# Patient Record
Sex: Female | Born: 1975 | Race: White | Hispanic: No | Marital: Married | Smoking: Never smoker
Health system: Southern US, Community
[De-identification: ages and names within clinical notes are randomized; demographics above are authoritative.]

## PROBLEM LIST (undated history)

## (undated) DIAGNOSIS — Z87442 Personal history of urinary calculi: Secondary | ICD-10-CM

## (undated) DIAGNOSIS — K219 Gastro-esophageal reflux disease without esophagitis: Secondary | ICD-10-CM

## (undated) DIAGNOSIS — J00 Acute nasopharyngitis [common cold]: Secondary | ICD-10-CM

## (undated) DIAGNOSIS — N200 Calculus of kidney: Secondary | ICD-10-CM

---

## 2007-04-18 ENCOUNTER — Emergency Department (HOSPITAL_COMMUNITY): Admission: EM | Admit: 2007-04-18 | Discharge: 2007-04-18 | Payer: Self-pay | Admitting: Family Medicine

## 2010-01-30 ENCOUNTER — Emergency Department (HOSPITAL_BASED_OUTPATIENT_CLINIC_OR_DEPARTMENT_OTHER)
Admission: EM | Admit: 2010-01-30 | Discharge: 2010-01-30 | Payer: Self-pay | Source: Home / Self Care | Admitting: Emergency Medicine

## 2010-01-31 ENCOUNTER — Encounter
Admission: RE | Admit: 2010-01-31 | Discharge: 2010-01-31 | Payer: Self-pay | Source: Home / Self Care | Attending: Family Medicine | Admitting: Family Medicine

## 2010-02-01 ENCOUNTER — Ambulatory Visit (HOSPITAL_COMMUNITY)
Admission: RE | Admit: 2010-02-01 | Discharge: 2010-02-02 | Payer: Self-pay | Source: Home / Self Care | Attending: Orthopedic Surgery | Admitting: Orthopedic Surgery

## 2010-02-01 HISTORY — PX: OTHER SURGICAL HISTORY: SHX169

## 2010-02-05 LAB — BASIC METABOLIC PANEL
Calcium: 9.2 mg/dL (ref 8.4–10.5)
Creatinine, Ser: 0.95 mg/dL (ref 0.4–1.2)
GFR calc Af Amer: >60 mL/min (ref >60)
GFR calc non Af Amer: >60 mL/min (ref >60)
Glucose, Bld: 109 mg/dL — ABNORMAL HIGH (ref 70–99)
Sodium: 139 mEq/L (ref 135–145)

## 2010-02-05 LAB — CBC
MCH: 26.8 pg (ref 26.0–34.0)
Platelets: 256 10*3/uL (ref 150–400)
RBC: 5.04 MIL/uL (ref 3.87–5.11)
WBC: 10.3 10*3/uL (ref 4.0–10.5)

## 2010-02-05 LAB — PROTIME-INR: Prothrombin Time: 12.7 seconds (ref 11.6–15.2)
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
## 2011-11-02 IMAGING — CR DG CHEST 2V
2 series · 2 of 2 positions shown · non-contrast
Comparison: None.

CLINICAL DATA: Preop

CHEST - 2 VIEW

[view not recorded (1 of 2)]
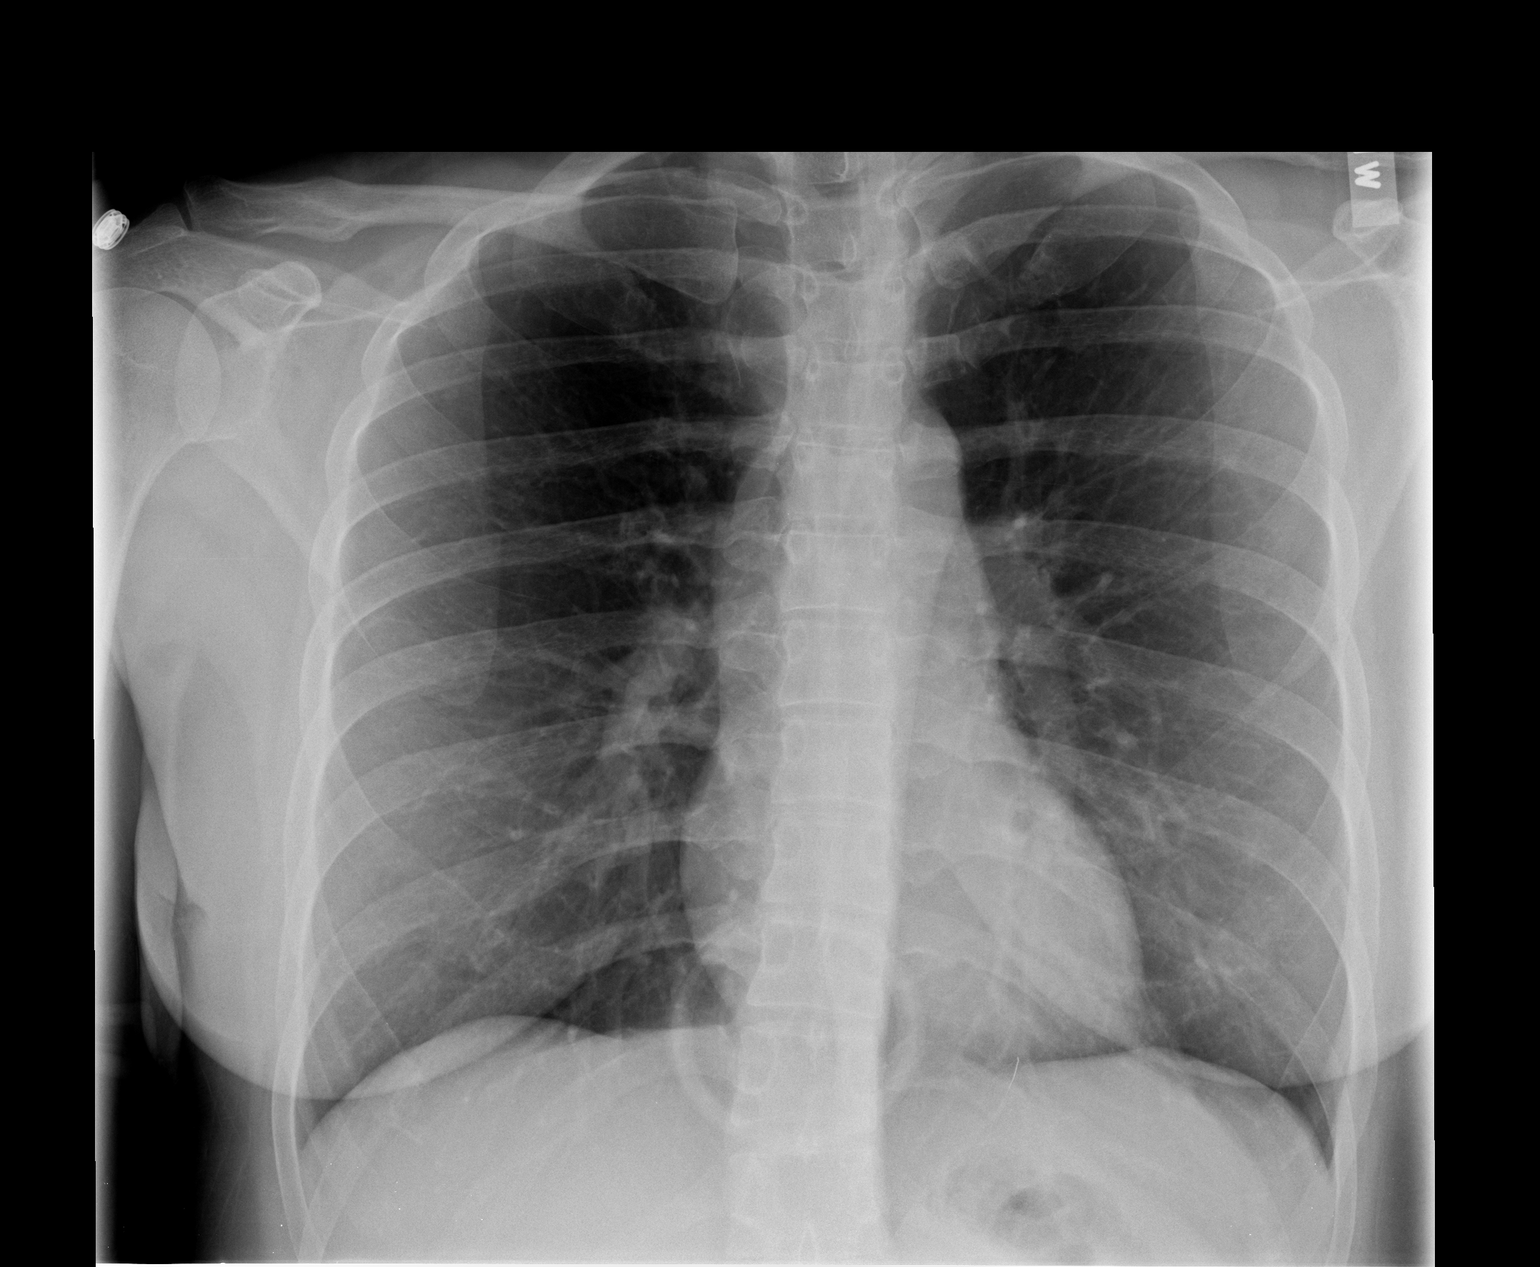

[view not recorded (2 of 2)]
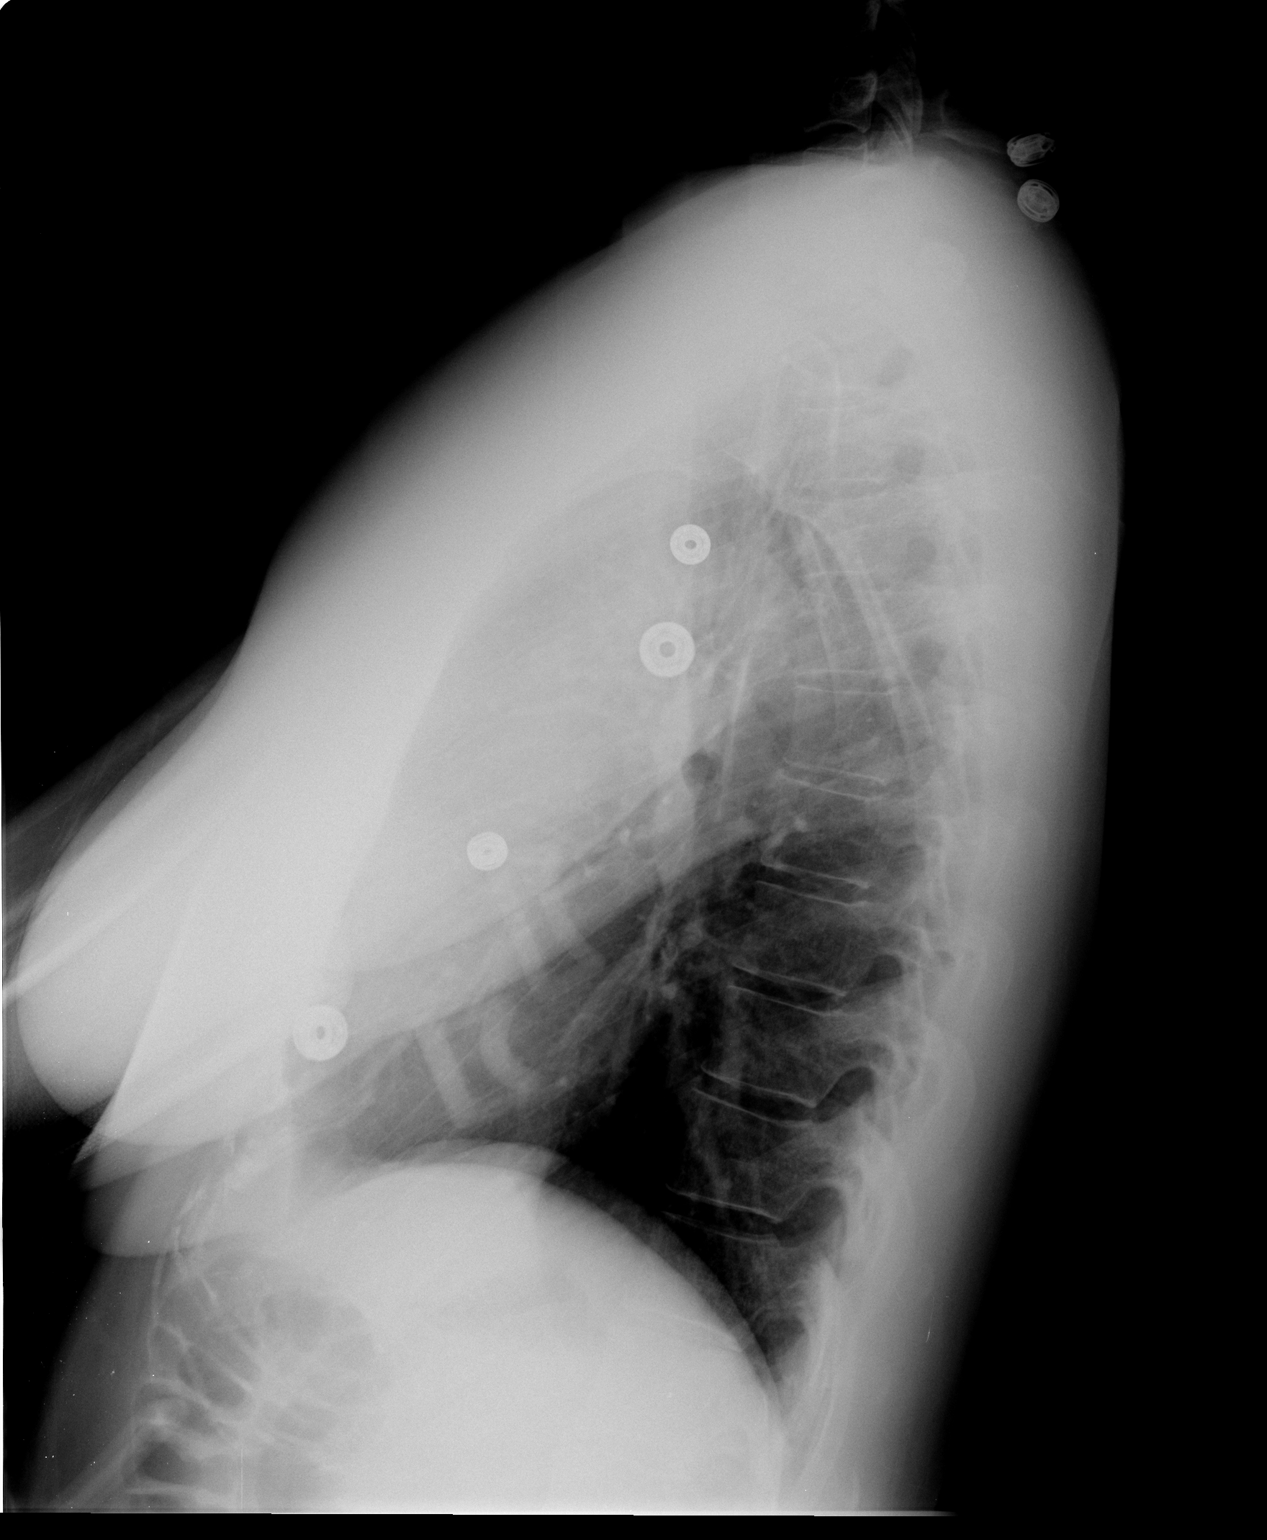

[2 of 2 positions shown; findings below may reference images not displayed]

FINDINGS: Normal heart size.  Clear lungs.  No pneumothorax.  No
pleural effusion.  External objects project over the chest on the
PA and lateral views.
IMPRESSION: No active cardiopulmonary disease.

## 2013-12-30 ENCOUNTER — Encounter (HOSPITAL_BASED_OUTPATIENT_CLINIC_OR_DEPARTMENT_OTHER): Payer: Self-pay | Admitting: *Deleted

## 2013-12-30 ENCOUNTER — Emergency Department (HOSPITAL_BASED_OUTPATIENT_CLINIC_OR_DEPARTMENT_OTHER)
Admission: EM | Admit: 2013-12-30 | Discharge: 2013-12-30 | Disposition: A | Discharge status: Home / Self Care | Payer: BC Managed Care – PPO | Attending: Emergency Medicine | Admitting: Emergency Medicine

## 2013-12-30 ENCOUNTER — Emergency Department (HOSPITAL_BASED_OUTPATIENT_CLINIC_OR_DEPARTMENT_OTHER): Payer: BC Managed Care – PPO

## 2013-12-30 DIAGNOSIS — R112 Nausea with vomiting, unspecified: Secondary | ICD-10-CM | POA: Insufficient documentation

## 2013-12-30 DIAGNOSIS — Z87442 Personal history of urinary calculi: Secondary | ICD-10-CM | POA: Insufficient documentation

## 2013-12-30 DIAGNOSIS — N390 Urinary tract infection, site not specified: Secondary | ICD-10-CM | POA: Diagnosis not present

## 2013-12-30 DIAGNOSIS — Z3202 Encounter for pregnancy test, result negative: Secondary | ICD-10-CM | POA: Diagnosis not present

## 2013-12-30 DIAGNOSIS — N201 Calculus of ureter: Secondary | ICD-10-CM | POA: Insufficient documentation

## 2013-12-30 DIAGNOSIS — R109 Unspecified abdominal pain: Secondary | ICD-10-CM

## 2013-12-30 DIAGNOSIS — R10814 Left lower quadrant abdominal tenderness: Secondary | ICD-10-CM | POA: Diagnosis present

## 2013-12-30 LAB — COMPREHENSIVE METABOLIC PANEL
ALK PHOS: 65 U/L (ref 39–117)
ALT: 13 U/L (ref 0–35)
AST: 19 U/L (ref 0–37)
Albumin: 3.8 g/dL (ref 3.5–5.2)
Anion gap: 15 (ref 5–15)
BILIRUBIN TOTAL: 0.9 mg/dL (ref 0.3–1.2)
BUN: 15 mg/dL (ref 6–23)
CHLORIDE: 102 meq/L (ref 96–112)
CO2: 19 meq/L (ref 19–32)
Calcium: 8.8 mg/dL (ref 8.4–10.5)
Creatinine, Ser: 1.2 mg/dL — ABNORMAL HIGH (ref 0.50–1.10)
GFR calc Af Amer: 65 mL/min — ABNORMAL LOW (ref >90)
GFR, EST NON AFRICAN AMERICAN: 56 mL/min — AB (ref >90)
Glucose, Bld: 124 mg/dL — ABNORMAL HIGH (ref 70–99)
POTASSIUM: 3.5 meq/L — AB (ref 3.7–5.3)
SODIUM: 136 meq/L — AB (ref 137–147)
Total Protein: 7.3 g/dL (ref 6.0–8.3)

## 2013-12-30 LAB — URINE MICROSCOPIC-ADD ON

## 2013-12-30 LAB — CBC WITH DIFFERENTIAL/PLATELET
BASOS ABS: 0 10*3/uL (ref 0.0–0.1)
Basophils Relative: 0 % (ref 0–1)
Eosinophils Absolute: 0.1 10*3/uL (ref 0.0–0.7)
Eosinophils Relative: 1 % (ref 0–5)
HCT: 35.6 % — ABNORMAL LOW (ref 36.0–46.0)
Hemoglobin: 12 g/dL (ref 12.0–15.0)
LYMPHS PCT: 4 % — AB (ref 12–46)
Lymphs Abs: 0.5 10*3/uL — ABNORMAL LOW (ref 0.7–4.0)
MCH: 26.7 pg (ref 26.0–34.0)
MCHC: 33.7 g/dL (ref 30.0–36.0)
MCV: 79.1 fL (ref 78.0–100.0)
Monocytes Absolute: 0.3 10*3/uL (ref 0.1–1.0)
Monocytes Relative: 2 % — ABNORMAL LOW (ref 3–12)
NEUTROS ABS: 12.3 10*3/uL — AB (ref 1.7–7.7)
Neutrophils Relative %: 93 % — ABNORMAL HIGH (ref 43–77)
PLATELETS: 188 10*3/uL (ref 150–400)
RBC: 4.5 MIL/uL (ref 3.87–5.11)
RDW: 14.6 % (ref 11.5–15.5)
WBC: 13.1 10*3/uL — AB (ref 4.0–10.5)

## 2013-12-30 LAB — URINALYSIS, ROUTINE W REFLEX MICROSCOPIC
Bilirubin Urine: NEGATIVE
Glucose, UA: NEGATIVE mg/dL
Ketones, ur: 15 mg/dL — AB
NITRITE: POSITIVE — AB
PH: 7.5 (ref 5.0–8.0)
Protein, ur: 30 mg/dL — AB
SPECIFIC GRAVITY, URINE: 1.016 (ref 1.005–1.030)
UROBILINOGEN UA: 1 mg/dL (ref 0.0–1.0)

## 2013-12-30 LAB — PREGNANCY, URINE: PREG TEST UR: NEGATIVE

## 2013-12-30 MED ORDER — ONDANSETRON 4 MG PO TBDP: ORAL_TABLET | ORAL | Status: DC

## 2013-12-30 MED ORDER — IBUPROFEN 600 MG PO TABS: 600.0000 mg | ORAL_TABLET | Freq: Four times a day (QID) | ORAL | Status: DC | PRN

## 2013-12-30 MED ORDER — OXYCODONE-ACETAMINOPHEN 5-325 MG PO TABS: 2.0000 | ORAL_TABLET | ORAL | Status: DC | PRN

## 2013-12-30 MED ORDER — KETOROLAC TROMETHAMINE 30 MG/ML IJ SOLN
30.0000 mg | Freq: Once | INTRAMUSCULAR | Status: AC
  Administered 2013-12-30: 30 mg via INTRAVENOUS
  Filled 2013-12-30: qty 1

## 2013-12-30 MED ORDER — DEXTROSE 5 % IV SOLN
1.0000 g | Freq: Once | INTRAVENOUS | Status: AC
  Administered 2013-12-30: 1 g via INTRAVENOUS

## 2013-12-30 MED ORDER — CEFTRIAXONE SODIUM 1 G IJ SOLR
INTRAMUSCULAR | Status: AC
  Filled 2013-12-30: qty 10

## 2013-12-30 MED ORDER — ONDANSETRON HCL 4 MG/2ML IJ SOLN
4.0000 mg | Freq: Once | INTRAMUSCULAR | Status: AC
  Administered 2013-12-30: 4 mg via INTRAVENOUS
  Filled 2013-12-30: qty 2

## 2013-12-30 MED ORDER — HYDROMORPHONE HCL 1 MG/ML IJ SOLN
1.0000 mg | Freq: Once | INTRAMUSCULAR | Status: AC
  Administered 2013-12-30: 1 mg via INTRAVENOUS
  Filled 2013-12-30: qty 1

## 2013-12-30 MED ORDER — SODIUM CHLORIDE 0.9 % IV BOLUS (SEPSIS)
1000.0000 mL | Freq: Once | INTRAVENOUS | Status: AC
  Administered 2013-12-30: 1000 mL via INTRAVENOUS

## 2013-12-30 NOTE — ED Provider Notes (Addendum)
CSN: [id number]     Arrival date & time 12/30/13  2031 History  This chart was scribed for Richardean Canalavid H Getsemani Lindon, MD by Lionel DecemberHatice Demirci, ED Scribe. This patient was seen in room MH02/MH02 and the patient's care was started at 8:50 PM.    Chief Complaint  Patient presents with  . Flank Pain     (Consider location/radiation/quality/duration/timing/severity/associated sxs/prior Treatment) The history is provided by the patient. No language interpreter was used.    HPI Comments: Charlotte Robbins is a 38 y.o. female who presents to the Emergency Department complaining of a dull and aching pain in her left hip onset at 11:30 am. Patient describes pain of being sharp at times   She notes that her pain has moved and is now worst in her left kidney.   Patient has had a kidney stone before and last had one 15 years ago which she passed without surgery.  Has no pain when urinating and is currently menstruating so she is not sure about hematuria.  Patient was vomiting earlier after eating but was able to keep her Gatorade in. She has no allergies and no other medical problems. She denies being pregnant.     Past Medical History  Diagnosis Date  . Kidney stones    History reviewed. No pertinent past surgical history. No family history on file. History  Substance Use Topics  . Smoking status: Never Smoker   . Smokeless tobacco: Not on file  . Alcohol Use: No   OB History    No data available     Review of Systems  Gastrointestinal: Positive for nausea and vomiting.  Genitourinary: Positive for flank pain. Negative for dysuria.  Musculoskeletal:       Left hip pain  All other systems reviewed and are negative.     Allergies  Review of patient's allergies indicates no known allergies.  Home Medications   Prior to Admission medications   Not on File   BP 118/74 mmHg  Pulse 118  Temp(Src) 98.2 F (36.8 C) (Oral)  Resp 22  Ht 5\' 9"  (1.753 m)  Wt 150 lb (68.04 kg)  BMI 22.14 kg/m2  SpO2  100%  LMP 12/28/2013 Physical Exam  Constitutional: She is oriented to person, place, and time. She appears well-developed and well-nourished.  HENT:  Head: Normocephalic and atraumatic.  Eyes: Conjunctivae are normal.  Neck: Normal range of motion. Neck supple.  Cardiovascular: Normal rate, regular rhythm and normal heart sounds.   No murmur heard. Pulmonary/Chest: Effort normal and breath sounds normal. No respiratory distress.  Abdominal: Soft. She exhibits no distension. There is tenderness. There is no rebound and no guarding.  Mild left cva tenderness  Mild LLQ tenderness    Musculoskeletal: Normal range of motion.  Neurological: She is alert and oriented to person, place, and time.  Skin: Skin is warm and dry.  Psychiatric: She has a normal mood and affect.  Nursing note and vitals reviewed.   ED Course  Procedures (including critical care time) DIAGNOSTIC STUDIES: Oxygen Saturation is100% on RA, normal by my interpretation.    COORDINATION OF CARE: 8:53 PM Discussed treatment plan with patient at beside, the patient agrees with the plan and has no further questions at this time.  Labs Review Labs Reviewed  URINALYSIS, ROUTINE W REFLEX MICROSCOPIC - Abnormal; Notable for the following:    Color, Urine BROWN (*)    APPearance CLOUDY (*)    Hgb urine dipstick LARGE (*)    Ketones, ur  15 (*)    Protein, ur 30 (*)    Nitrite POSITIVE (*)    Leukocytes, UA MODERATE (*)    All other components within normal limits  CBC WITH DIFFERENTIAL - Abnormal; Notable for the following:    WBC 13.1 (*)    HCT 35.6 (*)    Neutrophils Relative % 93 (*)    Neutro Abs 12.3 (*)    Lymphocytes Relative 4 (*)    Lymphs Abs 0.5 (*)    Monocytes Relative 2 (*)    All other components within normal limits  COMPREHENSIVE METABOLIC PANEL - Abnormal; Notable for the following:    Sodium 136 (*)    Potassium 3.5 (*)    Glucose, Bld 124 (*)    Creatinine, Ser 1.20 (*)    GFR calc non Af  Amer 56 (*)    GFR calc Af Amer 65 (*)    All other components within normal limits  URINE MICROSCOPIC-ADD ON - Abnormal; Notable for the following:    Squamous Epithelial / LPF FEW (*)    Bacteria, UA MANY (*)    All other components within normal limits  URINE CULTURE  PREGNANCY, URINE    Imaging Review Ct Renal Stone Study  12/30/2013   CLINICAL DATA:  Left flank pain.  Evaluate for renal stone.  EXAM: CT ABDOMEN AND PELVIS WITHOUT CONTRAST  TECHNIQUE: Multidetector CT imaging of the abdomen and pelvis was performed following the standard protocol without IV contrast.  COMPARISON:  None.  FINDINGS: Lower chest: The lung bases are clear. No pleural or pericardial effusion.  Hepatobiliary: There is no suspicious liver abnormality identified. The gallbladder appears normal.  Pancreas: Appears normal.  Spleen: Appears normal.  Adrenals/Urinary Tract: Normal adrenal glands. The right kidney appears normal. Inferior pole calculus is identified measuring 0.7 x 1.3 cm. There is mild left hydronephrosis secondary to a 1.5 x 0.6 cm calculus at the UPJ. The urinary bladder appears normal.  Stomach/Bowel: The stomach is within normal limits. The small bowel loops have a normal course and caliber. No obstruction. Normal appearance of the colon. The appendix is visualized and appears normal.  Vascular/Lymphatic: Normal appearance of the abdominal aorta. No enlarged retroperitoneal or mesenteric adenopathy. No enlarged pelvic or inguinal lymph nodes.  Reproductive: The uterus appears normal. There is enlargement of bilateral ovaries.  Other: There is no ascites or focal fluid collections within the abdomen or pelvis.  Musculoskeletal: Review of the visualized bony structures is unremarkable.  IMPRESSION: 1. Left UPJ calculus measures 1.5 x 0.6 cm. 2. Bilateral ovarian enlargement. Recommend followup, nonemergent pelvic sonogram for further assessment.   Electronically Signed   By: Signa Kell M.D.   On:  12/30/2013 21:43     EKG Interpretation None      MDM   Final diagnoses:  Left flank pain    KABAO LEITE is a 38 y.o. female here with L flank pain. Consider stone vs pyelo. Will get labs, CT ab/pel, UA.   10:09 PM UA + UTI. Has hematuria as well (on her period). CT showed L UPJ stone 1.5 cm. I am concerned for infected but she doesn't appear septic. I consulted Dr. Isabel Caprice, urologist, who reviewed the images and felt that positive UA likely from inflammation from the stone. Recommend getting urine culture and dc on cipro. HR dec to 100 on discharge. He will see her in 1-2 days.   12/18 8:35 PM I called patient to follow up on her visit. Realized  that I didn't give her a script for cipro but offered to call in to her pharmacy. Left her a message to call back. I also called Flow manager to contact her to prescribe cipro 500 mg BID x 2 weeks.    I personally performed the services described in this documentation, which was scribed in my presence. The recorded information has been reviewed and is accurate.   Richardean Canalavid H Maleni Seyer, MD 12/30/13 2211  Richardean Canalavid H Robben Jagiello, MD 12/30/13 [id number]  Richardean Canalavid H Kayzlee Wirtanen, MD 12/31/13 [phone]

## 2013-12-30 NOTE — ED Notes (Signed)
MD at bedside. 

## 2013-12-30 NOTE — ED Notes (Signed)
Left flank pain since this am. Worse tonight. Pale diaphoretic. Hx of kidney stones.

## 2013-12-30 NOTE — ED Notes (Signed)
Patient transported to CT 

## 2013-12-30 NOTE — Discharge Instructions (Signed)
Take motrin for pain.   Take percocet for severe pain.   Take zofran for nausea.   Stay hydrated.   Call [facility] tomorrow for follow up in 1-2 days.   Return to ER if you have fever > 101, severe pain, vomiting.

## 2014-01-01 LAB — URINE CULTURE

## 2014-01-03 ENCOUNTER — Other Ambulatory Visit: Payer: Self-pay | Admitting: Urology

## 2014-01-03 ENCOUNTER — Encounter (HOSPITAL_BASED_OUTPATIENT_CLINIC_OR_DEPARTMENT_OTHER): Payer: Self-pay | Admitting: *Deleted

## 2014-01-03 NOTE — H&P (Signed)
History of Present Illness Consult for nephrolithiasis referred by Dr. Silverio LayYao. PCP Dr. Zachery DauerBarnes.     1-nephrolithiasis-December 2015-patient developed left hip and flank pain earlier in the month. She underwent CT scan which revealed a CT 7 x 13 mm LLP stone (ssd 8.4, hu 1115, scout visible) and a 6 x 15 mm Left UPJ stone (ssd 10 cm, hu [id number], scout visible). I reviewed the images. Assoc with n/v. Pt has a history of stones. She passed one 15 yrs ago. UA bacteria. Cx grw > 100k mixed. Gfr 56-65.    Today, she continues to have left flank pain requiring pain medication as well as nausea and vomiting. They're leaving tomorrow for Christmas travel.   Past Medical History Problems  1. History of esophageal reflux (Z87.19) 2. History of renal calculi (R60.454(Z87.442)  Surgical History Problems  1. History of Wrist Incision  Current Meds 1. Antacid 600 MG CHEW;  Therapy: (Recorded:21Dec2015) to Recorded 2. Cipro 500 MG Oral Tablet;  Therapy: (Recorded:21Dec2015) to Recorded 3. Ibuprofen 600 MG Oral Tablet;  Therapy: (Recorded:21Dec2015) to Recorded 4. Ondansetron HCl - 4 MG Oral Tablet;  Therapy: (Recorded:21Dec2015) to Recorded 5. Percocet 5-325 MG Oral Tablet;  Therapy: (Recorded:21Dec2015) to Recorded  Allergies Medication  1. No Known Drug Allergies  Social History Problems    Caffeine use (F15.90)   Married   Never a smoker   Never a smoker   No alcohol use   Number of children   Occupation  Review of Systems Genitourinary, constitutional, skin, eye, otolaryngeal, hematologic/lymphatic, cardiovascular, pulmonary, endocrine, musculoskeletal, gastrointestinal, neurological and psychiatric system(s) were reviewed and pertinent findings if present are noted and are otherwise negative.    Vitals Vital Signs [Data Includes: Last 1 Day]  Recorded: 21Dec2015 10:44AM  Height: 5 ft 9 in Weight: 150 lb  BMI Calculated: 22.15 BSA Calculated: 1.83 Blood Pressure: 113 /  78 Temperature: 98.2 F Heart Rate: 78  Physical Exam Constitutional: Well nourished and well developed . No acute distress.  Pulmonary: No respiratory distress and normal respiratory rhythm and effort.  Cardiovascular: Heart rate and rhythm are normal . No peripheral edema.  Neuro/Psych:. Mood and affect are appropriate.    Results/Data Urine [Data Includes: Last 1 Day]   21Dec2015  COLOR AMBER   APPEARANCE CLOUDY   SPECIFIC GRAVITY 1.025   pH 5.5   GLUCOSE NEG mg/dL  BILIRUBIN NEG   KETONE NEG mg/dL  BLOOD LARGE   PROTEIN TRACE mg/dL  UROBILINOGEN 0.2 mg/dL  NITRITE NEG   LEUKOCYTE ESTERASE TRACE   SQUAMOUS EPITHELIAL/HPF MODERATE   WBC 7-10 WBC/hpf  RBC 11-20 RBC/hpf  BACTERIA NONE SEEN   CRYSTALS NONE SEEN   CASTS NONE SEEN    Assessment Assessed  1. Nephrolithiasis (N20.0)  Plan Health Maintenance  1. UA With REFLEX; [Do Not Release]; Status:Complete;   Done: 21Dec2015 10:30AM Nephrolithiasis  2. Start: Ondansetron HCl - 4 MG Oral Tablet; TAKE 1 TABLET Every 8 hours 3. Start: TraMADol HCl - 50 MG Oral Tablet; TAKE 1 TO 2 TABLETS 4 TIMES DAILY AS  NEEDED FOR PAIN 4. Follow-up Schedule Surgery Office  Follow-up  Status: Hold For - Appointment   Requested for: 21Dec2015  Discussion/Summary Left UPJ stone, left lower pole stone-we discussed the nature risk and benefits of cystoscopy with left ureteral stent placement ASAP due to her pain and nausea and vomiting. We also discussed in the long run scheduling elective shockwave lithotripsy, ureteroscopy or PCNL for the UPJ and LP stone. We discussed the nature  of risks benefits, success rates and potential need for staged procedures given each approach. All questions answered. They elected to proceed with ureteral stent placement ASAP and while stenting I'll attempt ureteroscopy, holmium laser lithotripsy and stent. We discussed starting with this approach she will likely need a staged procedure for the UPJ stone but also  for stone free status to clear both.      Signatures Electronically signed by : Jerilee FieldMatthew Maisa Bedingfield, M.D.; Jan 03 2014 11:36AM EST

## 2014-01-03 NOTE — Progress Notes (Signed)
NPO AFTER MN.  ARRIVE AT 1015.  CURRENT LAB RESULTS IN CHART AND EPIC.   

## 2014-01-04 ENCOUNTER — Ambulatory Visit (HOSPITAL_BASED_OUTPATIENT_CLINIC_OR_DEPARTMENT_OTHER): Payer: BC Managed Care – PPO | Admitting: Anesthesiology

## 2014-01-04 ENCOUNTER — Encounter (HOSPITAL_BASED_OUTPATIENT_CLINIC_OR_DEPARTMENT_OTHER): Payer: Self-pay | Admitting: Anesthesiology

## 2014-01-04 ENCOUNTER — Encounter (HOSPITAL_BASED_OUTPATIENT_CLINIC_OR_DEPARTMENT_OTHER)
Admission: RE | Disposition: A | Discharge status: Home / Self Care | Payer: Self-pay | Source: Ambulatory Visit | Attending: Urology

## 2014-01-04 ENCOUNTER — Ambulatory Visit (HOSPITAL_BASED_OUTPATIENT_CLINIC_OR_DEPARTMENT_OTHER)
Admission: RE | Admit: 2014-01-04 | Discharge: 2014-01-04 | Disposition: A | Discharge status: Home / Self Care | Payer: BC Managed Care – PPO | Source: Ambulatory Visit | Attending: Urology | Admitting: Urology

## 2014-01-04 DIAGNOSIS — K219 Gastro-esophageal reflux disease without esophagitis: Secondary | ICD-10-CM | POA: Insufficient documentation

## 2014-01-04 DIAGNOSIS — N202 Calculus of kidney with calculus of ureter: Secondary | ICD-10-CM | POA: Diagnosis present

## 2014-01-04 DIAGNOSIS — Z87442 Personal history of urinary calculi: Secondary | ICD-10-CM | POA: Insufficient documentation

## 2014-01-04 HISTORY — DX: Personal history of urinary calculi: Z87.442

## 2014-01-04 HISTORY — DX: Gastro-esophageal reflux disease without esophagitis: K21.9

## 2014-01-04 HISTORY — PX: CYSTOSCOPY W/ URETERAL STENT PLACEMENT: SHX1429

## 2014-01-04 SURGERY — CYSTOSCOPY, WITH RETROGRADE PYELOGRAM AND URETERAL STENT INSERTION
Anesthesia: General | Site: Ureter | Laterality: Left

## 2014-01-04 MED ORDER — CEFAZOLIN SODIUM 1-5 GM-% IV SOLN
1.0000 g | INTRAVENOUS | Status: DC
  Filled 2014-01-04: qty 50

## 2014-01-04 MED ORDER — PROPOFOL 10 MG/ML IV BOLUS
INTRAVENOUS | Status: DC | PRN
  Administered 2014-01-04: 200 mg via INTRAVENOUS

## 2014-01-04 MED ORDER — PROMETHAZINE HCL 25 MG/ML IJ SOLN
6.2500 mg | INTRAMUSCULAR | Status: DC | PRN
  Filled 2014-01-04: qty 1

## 2014-01-04 MED ORDER — EPHEDRINE SULFATE 50 MG/ML IJ SOLN
INTRAMUSCULAR | Status: DC | PRN
Start: 2014-01-04 — End: 2014-01-04
  Administered 2014-01-04 (×2): 10 mg via INTRAVENOUS

## 2014-01-04 MED ORDER — KETOROLAC TROMETHAMINE 30 MG/ML IJ SOLN
INTRAMUSCULAR | Status: DC | PRN
  Administered 2014-01-04: 30 mg via INTRAVENOUS

## 2014-01-04 MED ORDER — ACETAMINOPHEN 10 MG/ML IV SOLN
INTRAVENOUS | Status: DC | PRN
  Administered 2014-01-04: 1000 mg via INTRAVENOUS

## 2014-01-04 MED ORDER — DEXAMETHASONE SODIUM PHOSPHATE 10 MG/ML IJ SOLN
INTRAMUSCULAR | Status: DC | PRN
  Administered 2014-01-04: 10 mg via INTRAVENOUS

## 2014-01-04 MED ORDER — CEPHALEXIN 500 MG PO CAPS: 500.0000 mg | ORAL_CAPSULE | Freq: Three times a day (TID) | ORAL | Status: DC

## 2014-01-04 MED ORDER — ONDANSETRON HCL 4 MG/2ML IJ SOLN
INTRAMUSCULAR | Status: DC | PRN
  Administered 2014-01-04: 4 mg via INTRAVENOUS

## 2014-01-04 MED ORDER — LACTATED RINGERS IV SOLN
INTRAVENOUS | Status: DC | PRN
  Administered 2014-01-04 (×2): via INTRAVENOUS

## 2014-01-04 MED ORDER — SODIUM CHLORIDE 0.9 % IR SOLN
Status: DC | PRN
  Administered 2014-01-04: 6000 mL

## 2014-01-04 MED ORDER — CEFAZOLIN SODIUM-DEXTROSE 2-3 GM-% IV SOLR
INTRAVENOUS | Status: AC
  Filled 2014-01-04: qty 50

## 2014-01-04 MED ORDER — FENTANYL CITRATE 0.05 MG/ML IJ SOLN
INTRAMUSCULAR | Status: AC
  Filled 2014-01-04: qty 6

## 2014-01-04 MED ORDER — LACTATED RINGERS IV SOLN
INTRAVENOUS | Status: DC
Start: 2014-01-04 — End: 2014-01-04
  Administered 2014-01-04: 11:00:00 via INTRAVENOUS
  Filled 2014-01-04: qty 1000

## 2014-01-04 MED ORDER — SUCCINYLCHOLINE CHLORIDE 20 MG/ML IJ SOLN
INTRAMUSCULAR | Status: DC | PRN
  Administered 2014-01-04: 100 mg via INTRAVENOUS

## 2014-01-04 MED ORDER — IOHEXOL 350 MG/ML SOLN
INTRAVENOUS | Status: DC | PRN
  Administered 2014-01-04: 10 mL

## 2014-01-04 MED ORDER — LIDOCAINE HCL (CARDIAC) 20 MG/ML IV SOLN
INTRAVENOUS | Status: DC | PRN
Start: 2014-01-04 — End: 2014-01-04
  Administered 2014-01-04: 80 mg via INTRAVENOUS

## 2014-01-04 MED ORDER — METOCLOPRAMIDE HCL 5 MG/ML IJ SOLN
INTRAMUSCULAR | Status: DC | PRN
  Administered 2014-01-04: 10 mg via INTRAVENOUS

## 2014-01-04 MED ORDER — FENTANYL CITRATE 0.05 MG/ML IJ SOLN
INTRAMUSCULAR | Status: DC | PRN
  Administered 2014-01-04 (×8): 25 ug via INTRAVENOUS

## 2014-01-04 MED ORDER — STERILE WATER FOR IRRIGATION IR SOLN
Status: DC | PRN
  Administered 2014-01-04: 500 mL

## 2014-01-04 MED ORDER — MIDAZOLAM HCL 5 MG/5ML IJ SOLN
INTRAMUSCULAR | Status: DC | PRN
  Administered 2014-01-04 (×2): 1 mg via INTRAVENOUS

## 2014-01-04 MED ORDER — MIDAZOLAM HCL 2 MG/2ML IJ SOLN
INTRAMUSCULAR | Status: AC
  Filled 2014-01-04: qty 2

## 2014-01-04 MED ORDER — FENTANYL CITRATE 0.05 MG/ML IJ SOLN
25.0000 ug | INTRAMUSCULAR | Status: DC | PRN
  Filled 2014-01-04: qty 1

## 2014-01-04 MED ORDER — CEFAZOLIN SODIUM-DEXTROSE 2-3 GM-% IV SOLR
INTRAVENOUS | Status: DC | PRN
  Administered 2014-01-04: 2 g via INTRAVENOUS

## 2014-01-04 SURGICAL SUPPLY — 36 items
ADAPTER CATH URET PLST 4-6FR (CATHETERS) IMPLANT
BAG DRAIN URO-CYSTO SKYTR STRL (DRAIN) ×4 IMPLANT
BASKET LASER NITINOL 1.9FR (BASKET) IMPLANT
BASKET STNLS GEMINI 4WIRE 3FR (BASKET) IMPLANT
BASKET ZERO TIP NITINOL 2.4FR (BASKET) IMPLANT
CANISTER SUCT LVC 12 LTR MEDI- (MISCELLANEOUS) ×4 IMPLANT
CATH INTERMIT  6FR 70CM (CATHETERS) IMPLANT
CATH URET 5FR 28IN CONE TIP (BALLOONS)
CATH URET 5FR 28IN OPEN ENDED (CATHETERS) ×4 IMPLANT
CATH URET 5FR 70CM CONE TIP (BALLOONS) IMPLANT
CATH URET DUAL LUMEN 6-10FR 50 (CATHETERS) ×4 IMPLANT
CLOTH BEACON ORANGE TIMEOUT ST (SAFETY) ×4 IMPLANT
DRAPE CAMERA CLOSED 9X96 (DRAPES) ×4 IMPLANT
ELECT REM PT RETURN 9FT ADLT (ELECTROSURGICAL)
ELECTRODE REM PT RTRN 9FT ADLT (ELECTROSURGICAL) IMPLANT
GLOVE BIO SURGEON STRL SZ7.5 (GLOVE) ×4 IMPLANT
GOWN PREVENTION PLUS LG XLONG (DISPOSABLE) IMPLANT
GOWN STRL REIN XL XLG (GOWN DISPOSABLE) IMPLANT
GOWN STRL REUS W/ TWL LRG LVL3 (GOWN DISPOSABLE) ×2 IMPLANT
GOWN STRL REUS W/ TWL XL LVL3 (GOWN DISPOSABLE) ×2 IMPLANT
GOWN STRL REUS W/TWL LRG LVL3 (GOWN DISPOSABLE) ×2
GOWN STRL REUS W/TWL XL LVL3 (GOWN DISPOSABLE) ×2
GUIDEWIRE 0.038 PTFE COATED (WIRE) IMPLANT
GUIDEWIRE ANG ZIPWIRE 038X150 (WIRE) IMPLANT
GUIDEWIRE STR DUAL SENSOR (WIRE) ×4 IMPLANT
IV NS IRRIG 3000ML ARTHROMATIC (IV SOLUTION) ×8 IMPLANT
KIT BALLIN UROMAX 15FX10 (LABEL) IMPLANT
KIT BALLN UROMAX 15FX4 (MISCELLANEOUS) IMPLANT
KIT BALLN UROMAX 26 75X4 (MISCELLANEOUS)
PACK CYSTO (CUSTOM PROCEDURE TRAY) ×4 IMPLANT
SET HIGH PRES BAL DIL (LABEL)
SHEATH ACCESS URETERAL 38CM (SHEATH) IMPLANT
SHEATH URET ACCESS 12FR/35CM (UROLOGICAL SUPPLIES) IMPLANT
SHEATH URET ACCESS 12FR/55CM (UROLOGICAL SUPPLIES) IMPLANT
STENT URET 6FRX26 CONTOUR (STENTS) ×4 IMPLANT
SYRINGE IRR TOOMEY STRL 70CC (SYRINGE) IMPLANT

## 2014-01-04 NOTE — Anesthesia Preprocedure Evaluation (Addendum)
Anesthesia Evaluation  Patient identified by MRN, date of birth, ID band Patient awake    Reviewed: Allergy & Precautions, H&P , NPO status , Patient's Chart, lab work & pertinent test results  Airway Mallampati: II  TM Distance: >3 FB Neck ROM: Full    Dental no notable dental hx.    Pulmonary neg pulmonary ROS,  breath sounds clear to auscultation  Pulmonary exam normal       Cardiovascular negative cardio ROS  Rhythm:Regular Rate:Normal     Neuro/Psych negative neurological ROS  negative psych ROS   GI/Hepatic Neg liver ROS, GERD-  Medicated,  Endo/Other  negative endocrine ROS  Renal/GU negative Renal ROS  negative genitourinary   Musculoskeletal negative musculoskeletal ROS (+)   Abdominal   Peds negative pediatric ROS (+)  Hematology negative hematology ROS (+)   Anesthesia Other Findings   Reproductive/Obstetrics negative OB ROS                             Anesthesia Physical Anesthesia Plan  ASA: II  Anesthesia Plan: General   Post-op Pain Management:    Induction: Intravenous  Airway Management Planned: Oral ETT  Additional Equipment:   Intra-op Plan:   Post-operative Plan: Extubation in OR  Informed Consent: I have reviewed the patients History and Physical, chart, labs and discussed the procedure including the risks, benefits and alternatives for the proposed anesthesia with the patient or authorized representative who has indicated his/her understanding and acceptance.   Dental advisory given  Plan Discussed with: CRNA  Anesthesia Plan Comments: (Has been nauseated. Plan ETT)       Anesthesia Quick Evaluation

## 2014-01-04 NOTE — Discharge Instructions (Signed)
Ureteral Stent Implantation, Care After Refer to this sheet in the next few weeks. These instructions provide you with information on caring for yourself after your procedure. Your health care provider may also give you more specific instructions. Your treatment has been planned according to current medical practices, but problems sometimes occur. Call your health care provider if you have any problems or questions after your procedure. WHAT TO EXPECT AFTER THE PROCEDURE You should be back to normal activity within 48 hours after the procedure. Nausea and vomiting may occur and are commonly the result of anesthesia. It is common to experience sharp pain in the back or lower abdomen and penis with voiding. This is caused by movement of the ends of the stent with the act of urinating.It usually goes away within minutes after you have stopped urinating. HOME CARE INSTRUCTIONS Make sure to drink plenty of fluids. You may have small amounts of bleeding, causing your urine to be red. This is normal. Certain movements may trigger pain or a feeling that you need to urinate. You may be given medicines to prevent infection or bladder spasms. Be sure to take all medicines as directed. Only take over-the-counter or prescription medicines for pain, discomfort, or fever as directed by your health care provider. Do not take aspirin, as this can make bleeding worse. Your stent will be left in until the kidney stones/blockage is resolved. This may take weeks or longer, depending on the reason for stent implantation. You may have an X-ray exam to make sure your ureter is open and that the stent has not moved out of position (migrated). The stent can be removed by your health care provider in the office. Medicines may be given for comfort while the stent is being removed. Be sure to keep all follow-up appointments so your health care provider can check that you are healing properly. SEEK MEDICAL CARE IF:  You experience  increasing pain.  Your pain medicine is not working. SEEK IMMEDIATE MEDICAL CARE IF:  Your urine is dark red or has blood clots.  You are leaking urine (incontinent).  You have a fever, chills, feeling sick to your stomach (nausea), or vomiting.  Your pain is not relieved by pain medicine.  The end of the stent comes out of the urethra.  You are unable to urinate.   Post Anesthesia Home Care Instructions  Activity: Get plenty of rest for the remainder of the day. A responsible adult should stay with you for 24 hours following the procedure.  For the next 24 hours, DO NOT: -Drive a car -Advertising copywriterperate machinery -Drink alcoholic beverages -Take any medication unless instructed by your physician -Make any legal decisions or sign important papers.  Meals: Start with liquid foods such as gelatin or soup. Progress to regular foods as tolerated. Avoid greasy, spicy, heavy foods. If nausea and/or vomiting occur, drink only clear liquids until the nausea and/or vomiting subsides. Call your physician if vomiting continues.  Special Instructions/Symptoms: Your throat may feel dry or sore from the anesthesia or the breathing tube placed in your throat during surgery. If this causes discomfort, gargle with warm salt water. The discomfort should disappear within 24 hours.

## 2014-01-04 NOTE — Interval H&P Note (Signed)
History and Physical Interval Note:  01/04/2014 11:35 AM  Charlotte Robbins  has presented today for surgery, with the diagnosis of LEFT UPJ STONE   The various methods of treatment have been discussed with the patient and family. After consideration of risks, benefits and other options for treatment, the patient has consented to  Procedure(s): CYSTOSCOPY WITH LEFT URETEROSCOPY AND STENT PLACEMENT (Left) WITH HOLMIUM LASER  (Left) as a surgical intervention .  The patient's history has been reviewed, patient examined, no change in status, stable for surgery.  I have reviewed the patient's chart and labs.  Questions were answered to the patient's satisfaction. Pt well today. Discussed cysto, left ureteral stent, attempt at HLL/basket extraction. Again, may need prestenting and likely staged procedure. She's still having flank pain and Nausea. No fever or chills. Urine Cx from hospital mixed. Office UA no bacteria.    [name]

## 2014-01-04 NOTE — Op Note (Signed)
Preoperative diagnosis: Left UPJ stone, left nephrolithiasis Postoperative diagnosis: Same  Procedure: Cystoscopy, left retrograde pyelogram, left ureteral stent placement  Surgeon: Frantz Quattrone  Type of anesthesia: Gen.  Indication for procedure: Patient is a 38 year old female with a large stone at the left UPJ. She continued to have some pain and nausea. She has brought today for cystoscopy with left retrograde pyelogram left ureteral stent placement and possible attempted holmium laser lithotripsy.  Findings: On cystoscopy the urethra was normal. The bladder was remarkable for typical small patchy areas of glomerulations consistent with some chronic cystitis. There was no stone or foreign body in the bladder. No obvious tumors.  Left retrograde pyelogram outlined a normal ureter with filling defect at the left UPJ consistent with the known stone. The collecting system proximally was dilated. I did not appreciate any duplicated system. There appeared to be a single ureter single collecting system unit. It should be noted on scout imaging the UPJ stone in the lower pole stone were visible.  After placing the wire a copious amount of dirty purulent urine drained from the kidney. I sent this for culture. Given these findings I decided not to proceed with ureteroscopy today.  Description of procedure: After consent was obtained patient brought to the operating room. After adequate anesthesia she was placed in lithotomy position and prepped and draped in the usual sterile fashion. A timeout was performed to confirm the patient and procedure. A cystoscope was passed per urethra and the bladder irrigated 3 to remove any bacteria. The left ureteral orifice was then cannulated with a 6 [place] open-ended catheter with retrograde injection of contrast performed. Initially contrast did not pass proximal to the stone but initially filled a dilated collecting system.  A sensor wire was then advanced past the  stone and coiled in the collecting system. A copious amount of thick purulent urine drained. Photos some of the remnant was taken as the stent went up. The 6 French open catheter was advanced past the stone into the collecting system. About 8 mL of purulent fluid was aspirated and sent for culture. I did another gentle retrograde which confirmed placement of the 6 French open-ended catheter in the collecting system lumen. The sensor wire was then repassed. The 6 [place] open-ended catheter removed. A 6 x 26 cm stent was advanced and the wire removed. A good coil reconstituted in the renal pelvis above the stone and a good coil in the bladder. There was excellent drainage through the stent holes of purulent cloudy fluid. The bladder was drained and the scope removed. The patient was awakened and taken to recovery room in stable condition.  Complications: None  Blood loss: Minimal  Drains: None  Specimens: Left renal aspirate for culture  Disposition: Patient stable to PACU.

## 2014-01-04 NOTE — Transfer of Care (Signed)
Immediate Anesthesia Transfer of Care Note  Patient: Charlotte Robbins  Procedure(s) Performed: Procedure(s) (LRB): CYSTOSCOPY WITH RETROGRADE PYELOGRAM/URETERAL STENT PLACEMENT (Left)  Patient Location: PACU  Anesthesia Type: General  Level of Consciousness: awake, sedated, patient cooperative and responds to stimulation  Airway & Oxygen Therapy: Patient Spontanous Breathing and Patient connected to face mask oxygen  Post-op Assessment: Report given to PACU RN, Post -op Vital signs reviewed and stable and Patient moving all extremities  Post vital signs: Reviewed and stable  Complications: No apparent anesthesia complications

## 2014-01-04 NOTE — Anesthesia Procedure Notes (Signed)
Procedure Name: Intubation Date/Time: 01/04/2014 11:45 AM Performed by: Jessica PriestBEESON, Charlotte Robbins Pre-anesthesia Checklist: Patient identified, Emergency Drugs available, Suction available and Patient being monitored Patient Re-evaluated:Patient Re-evaluated prior to inductionOxygen Delivery Method: Circle System Utilized Preoxygenation: Pre-oxygenation with 100% oxygen Intubation Type: IV induction Ventilation: Mask ventilation without difficulty Laryngoscope Size: Mac and 3 Grade View: Grade I Tube type: Oral Tube size: 7.0 mm Number of attempts: 1 Airway Equipment and Method: stylet and oral airway Placement Confirmation: ETT inserted through vocal cords under direct vision,  positive ETCO2 and breath sounds checked- equal and bilateral Secured at: 22 cm Tube secured with: Tape Dental Injury: Teeth and Oropharynx as per pre-operative assessment

## 2014-01-05 ENCOUNTER — Encounter (HOSPITAL_BASED_OUTPATIENT_CLINIC_OR_DEPARTMENT_OTHER): Payer: Self-pay | Admitting: Urology

## 2014-01-05 NOTE — Anesthesia Postprocedure Evaluation (Signed)
  Anesthesia Post-op Note  Patient: Charlotte Robbins  Procedure(s) Performed: Procedure(s) (LRB): CYSTOSCOPY WITH RETROGRADE PYELOGRAM/URETERAL STENT PLACEMENT (Left)  Patient Location: PACU  Anesthesia Type: General  Level of Consciousness: awake and alert   Airway and Oxygen Therapy: Patient Spontanous Breathing  Post-op Pain: mild  Post-op Assessment: Post-op Vital signs reviewed, Patient's Cardiovascular Status Stable, Respiratory Function Stable, Patent Airway and No signs of Nausea or vomiting  Last Vitals:  Filed Vitals:   01/04/14 1345  BP: 118/58  Pulse: 81  Temp: 36.2 C  Resp: 14    Post-op Vital Signs: stable   Complications: No apparent anesthesia complications

## 2014-01-08 LAB — URINE CULTURE

## 2014-01-09 LAB — ANAEROBIC CULTURE

## 2014-01-11 ENCOUNTER — Other Ambulatory Visit: Payer: Self-pay | Admitting: Urology

## 2014-01-25 ENCOUNTER — Encounter (HOSPITAL_BASED_OUTPATIENT_CLINIC_OR_DEPARTMENT_OTHER): Payer: Self-pay | Admitting: *Deleted

## 2014-01-25 NOTE — Progress Notes (Signed)
NPO AFTER MN. ARRIVE AT [id number]. CURRENT LAB RESULTS IN CHART AND EPIC. WILL  TAKE ZANTAC AND IF NEEDED TAKE TRAMADOL AM DOS W/ SIPS OF WATER. ALSO, PT HAD STOPPED ANTIBIOTIC KEFLEX , SHE THOUGHT SHE DID NOT IT , ADVISED TO RESTART IT TODAY THAT DR ESKRIDGE  WAS PREVENTING INFECTION.  PT VERBALIZED UNDERSTANDING AND STATE SHE WILL START TAKING IT TODAY.

## 2014-01-27 NOTE — H&P (Signed)
History of Present Illness Consult for nephrolithiasis referred by Dr. Silverio LayYao. PCP Dr. Zachery DauerBarnes.     1-nephrolithiasis-December 2015-patient developed left hip and flank pain earlier in the month. She underwent CT scan which revealed a CT 7 x 13 mm LLP stone (ssd 8.4, hu 1115, scout visible) and a 6 x 15 mm Left UPJ stone (ssd 10 cm, hu [id number], scout visible). I reviewed the images. Assoc with n/v. Pt has a history of stones. She passed one 15 yrs ago. UA bacteria. Cx grw > 100k mixed. Gfr 56-65.    Today, she continues to have left flank pain requiring pain medication as well as nausea and vomiting. They're leaving tomorrow for Christmas travel.   Past Medical History Problems  1. History of esophageal reflux (Z87.19) 2. History of renal calculi (R60.454(Z87.442)  Surgical History Problems  1. History of Wrist Incision  Current Meds 1. Antacid 600 MG CHEW;  Therapy: (Recorded:21Dec2015) to Recorded 2. Cipro 500 MG Oral Tablet;  Therapy: (Recorded:21Dec2015) to Recorded 3. Ibuprofen 600 MG Oral Tablet;  Therapy: (Recorded:21Dec2015) to Recorded 4. Ondansetron HCl - 4 MG Oral Tablet;  Therapy: (Recorded:21Dec2015) to Recorded 5. Percocet 5-325 MG Oral Tablet;  Therapy: (Recorded:21Dec2015) to Recorded  Allergies Medication  1. No Known Drug Allergies  Social History Problems    Caffeine use (F15.90)   Married   Never a smoker   Never a smoker   No alcohol use   Number of children   Occupation  Review of Systems Genitourinary, constitutional, skin, eye, otolaryngeal, hematologic/lymphatic, cardiovascular, pulmonary, endocrine, musculoskeletal, gastrointestinal, neurological and psychiatric system(s) were reviewed and pertinent findings if present are noted and are otherwise negative.    Vitals Vital Signs [Data Includes: Last 1 Day]  Recorded: 21Dec2015 10:44AM  Height: 5 ft 9 in Weight: 150 lb  BMI Calculated: 22.15 BSA Calculated: 1.83 Blood Pressure: 113 /  78 Temperature: 98.2 F Heart Rate: 78  Physical Exam Constitutional: Well nourished and well developed . No acute distress.  Pulmonary: No respiratory distress and normal respiratory rhythm and effort.  Cardiovascular: Heart rate and rhythm are normal . No peripheral edema.  Neuro/Psych:. Mood and affect are appropriate.    Results/Data Urine [Data Includes: Last 1 Day]   21Dec2015  COLOR AMBER   APPEARANCE CLOUDY   SPECIFIC GRAVITY 1.025   pH 5.5   GLUCOSE NEG mg/dL  BILIRUBIN NEG   KETONE NEG mg/dL  BLOOD LARGE   PROTEIN TRACE mg/dL  UROBILINOGEN 0.2 mg/dL  NITRITE NEG   LEUKOCYTE ESTERASE TRACE   SQUAMOUS EPITHELIAL/HPF MODERATE   WBC 7-10 WBC/hpf  RBC 11-20 RBC/hpf  BACTERIA NONE SEEN   CRYSTALS NONE SEEN   CASTS NONE SEEN    Assessment Assessed  1. Nephrolithiasis (N20.0)  Plan Health Maintenance  1. UA With REFLEX; [Do Not Release]; Status:Complete;   Done: 21Dec2015 10:30AM Nephrolithiasis  2. Start: Ondansetron HCl - 4 MG Oral Tablet; TAKE 1 TABLET Every 8 hours 3. Start: TraMADol HCl - 50 MG Oral Tablet; TAKE 1 TO 2 TABLETS 4 TIMES DAILY AS  NEEDED FOR PAIN 4. Follow-up Schedule Surgery Office  Follow-up  Status: Hold For - Appointment   Requested for: 21Dec2015  Discussion/Summary Left UPJ stone, left lower pole stone-we discussed the nature risk and benefits of cystoscopy with left ureteral stent placement ASAP due to her pain and nausea and vomiting. We also discussed in the long run scheduling elective shockwave lithotripsy, ureteroscopy or PCNL for the UPJ and LP stone. We discussed the nature  of risks benefits, success rates and potential need for staged procedures given each approach. All questions answered. They elected to proceed with ureteral stent placement ASAP and while stenting I'll attempt ureteroscopy, holmium laser lithotripsy and stent. We discussed starting with this approach she will likely need a staged procedure for the UPJ stone but also  for stone free status to clear both.      Signatures Electronically signed by : Jerilee FieldMatthew Jasma Seevers, M.D.; Jan 03 2014 11:36AM EST   ADD: Pt underwent cystoscopy, left ureteral stent Dec 2015. Urine aspirate grew e coli. I treated her with cephalexin. I sent Cipro today for additional coverage, but I don't feel it's essential.

## 2014-01-28 ENCOUNTER — Ambulatory Visit (HOSPITAL_BASED_OUTPATIENT_CLINIC_OR_DEPARTMENT_OTHER): Payer: BLUE CROSS/BLUE SHIELD | Admitting: Anesthesiology

## 2014-01-28 ENCOUNTER — Ambulatory Visit (HOSPITAL_BASED_OUTPATIENT_CLINIC_OR_DEPARTMENT_OTHER)
Admission: RE | Admit: 2014-01-28 | Discharge: 2014-01-28 | Disposition: A | Discharge status: Home / Self Care | Payer: BLUE CROSS/BLUE SHIELD | Source: Ambulatory Visit | Attending: Urology | Admitting: Urology

## 2014-01-28 ENCOUNTER — Encounter (HOSPITAL_BASED_OUTPATIENT_CLINIC_OR_DEPARTMENT_OTHER): Payer: Self-pay | Admitting: *Deleted

## 2014-01-28 ENCOUNTER — Encounter (HOSPITAL_BASED_OUTPATIENT_CLINIC_OR_DEPARTMENT_OTHER)
Admission: RE | Disposition: A | Discharge status: Home / Self Care | Payer: Self-pay | Source: Ambulatory Visit | Attending: Urology

## 2014-01-28 DIAGNOSIS — K219 Gastro-esophageal reflux disease without esophagitis: Secondary | ICD-10-CM | POA: Insufficient documentation

## 2014-01-28 DIAGNOSIS — N289 Disorder of kidney and ureter, unspecified: Secondary | ICD-10-CM | POA: Insufficient documentation

## 2014-01-28 DIAGNOSIS — N201 Calculus of ureter: Secondary | ICD-10-CM | POA: Insufficient documentation

## 2014-01-28 DIAGNOSIS — Z79899 Other long term (current) drug therapy: Secondary | ICD-10-CM | POA: Diagnosis not present

## 2014-01-28 DIAGNOSIS — N200 Calculus of kidney: Secondary | ICD-10-CM | POA: Diagnosis present

## 2014-01-28 DIAGNOSIS — Z791 Long term (current) use of non-steroidal anti-inflammatories (NSAID): Secondary | ICD-10-CM | POA: Diagnosis not present

## 2014-01-28 HISTORY — DX: Acute nasopharyngitis (common cold): J00

## 2014-01-28 HISTORY — PX: CYSTOSCOPY WITH URETEROSCOPY: SHX5123

## 2014-01-28 HISTORY — PX: CYSTOSCOPY W/ URETERAL STENT PLACEMENT: SHX1429

## 2014-01-28 HISTORY — PX: HOLMIUM LASER APPLICATION: SHX5852

## 2014-01-28 HISTORY — DX: Calculus of kidney: N20.0

## 2014-01-28 LAB — POCT PREGNANCY, URINE: Preg Test, Ur: NEGATIVE

## 2014-01-28 SURGERY — HOLMIUM LASER APPLICATION
Anesthesia: General | Site: Ureter | Laterality: Left

## 2014-01-28 MED ORDER — PROMETHAZINE HCL 25 MG/ML IJ SOLN
6.2500 mg | INTRAMUSCULAR | Status: DC | PRN
  Filled 2014-01-28: qty 1

## 2014-01-28 MED ORDER — MIDAZOLAM HCL 5 MG/5ML IJ SOLN
INTRAMUSCULAR | Status: DC | PRN
  Administered 2014-01-28 (×2): 1 mg via INTRAVENOUS

## 2014-01-28 MED ORDER — GLYCOPYRROLATE 0.2 MG/ML IJ SOLN
INTRAMUSCULAR | Status: DC | PRN
  Administered 2014-01-28: 0.2 mg via INTRAVENOUS

## 2014-01-28 MED ORDER — ACETAMINOPHEN 10 MG/ML IV SOLN
INTRAVENOUS | Status: DC | PRN
  Administered 2014-01-28: 1000 mg via INTRAVENOUS

## 2014-01-28 MED ORDER — LACTATED RINGERS IV SOLN
INTRAVENOUS | Status: DC
  Administered 2014-01-28: 09:00:00 via INTRAVENOUS
  Filled 2014-01-28: qty 1000

## 2014-01-28 MED ORDER — FENTANYL CITRATE 0.05 MG/ML IJ SOLN
INTRAMUSCULAR | Status: DC | PRN
  Administered 2014-01-28 (×4): 12.5 ug via INTRAVENOUS
  Administered 2014-01-28: 50 ug via INTRAVENOUS

## 2014-01-28 MED ORDER — BELLADONNA ALKALOIDS-OPIUM 16.2-60 MG RE SUPP
RECTAL | Status: DC | PRN
  Administered 2014-01-28: 1 via RECTAL

## 2014-01-28 MED ORDER — EPHEDRINE SULFATE 50 MG/ML IJ SOLN
INTRAMUSCULAR | Status: DC | PRN
  Administered 2014-01-28 (×3): 10 mg via INTRAVENOUS

## 2014-01-28 MED ORDER — CEFAZOLIN SODIUM 1-5 GM-% IV SOLN
1.0000 g | INTRAVENOUS | Status: DC
  Filled 2014-01-28: qty 50

## 2014-01-28 MED ORDER — FENTANYL CITRATE 0.05 MG/ML IJ SOLN
25.0000 ug | INTRAMUSCULAR | Status: DC | PRN
  Filled 2014-01-28: qty 1

## 2014-01-28 MED ORDER — LACTATED RINGERS IV SOLN
INTRAVENOUS | Status: DC | PRN
  Administered 2014-01-28 (×2): via INTRAVENOUS

## 2014-01-28 MED ORDER — LIDOCAINE HCL (CARDIAC) 20 MG/ML IV SOLN
INTRAVENOUS | Status: DC | PRN
  Administered 2014-01-28: 100 mg via INTRAVENOUS

## 2014-01-28 MED ORDER — SODIUM CHLORIDE 0.9 % IR SOLN
Status: DC | PRN
  Administered 2014-01-28: 4000 mL

## 2014-01-28 MED ORDER — CEFAZOLIN SODIUM-DEXTROSE 2-3 GM-% IV SOLR
2.0000 g | INTRAVENOUS | Status: AC
  Administered 2014-01-28: 2 g via INTRAVENOUS
  Filled 2014-01-28: qty 50

## 2014-01-28 MED ORDER — ONDANSETRON HCL 4 MG/2ML IJ SOLN
INTRAMUSCULAR | Status: DC | PRN
  Administered 2014-01-28: 4 mg via INTRAVENOUS

## 2014-01-28 MED ORDER — PROPOFOL 10 MG/ML IV BOLUS
INTRAVENOUS | Status: DC | PRN
  Administered 2014-01-28: 200 mg via INTRAVENOUS

## 2014-01-28 MED ORDER — FENTANYL CITRATE 0.05 MG/ML IJ SOLN
INTRAMUSCULAR | Status: AC
  Filled 2014-01-28: qty 6

## 2014-01-28 MED ORDER — BELLADONNA ALKALOIDS-OPIUM 16.2-60 MG RE SUPP
RECTAL | Status: AC
  Filled 2014-01-28: qty 1

## 2014-01-28 MED ORDER — CEFAZOLIN SODIUM-DEXTROSE 2-3 GM-% IV SOLR
INTRAVENOUS | Status: AC
  Filled 2014-01-28: qty 50

## 2014-01-28 MED ORDER — MIDAZOLAM HCL 2 MG/2ML IJ SOLN
INTRAMUSCULAR | Status: AC
  Filled 2014-01-28: qty 2

## 2014-01-28 MED ORDER — KETOROLAC TROMETHAMINE 30 MG/ML IJ SOLN
INTRAMUSCULAR | Status: DC | PRN
  Administered 2014-01-28: 30 mg via INTRAVENOUS

## 2014-01-28 MED ORDER — DEXAMETHASONE SODIUM PHOSPHATE 10 MG/ML IJ SOLN
INTRAMUSCULAR | Status: DC | PRN
  Administered 2014-01-28: 10 mg via INTRAVENOUS

## 2014-01-28 SURGICAL SUPPLY — 39 items
ADAPTER CATH URET PLST 4-6FR (CATHETERS) IMPLANT
BAG DRAIN URO-CYSTO SKYTR STRL (DRAIN) ×4 IMPLANT
BASKET LASER NITINOL 1.9FR (BASKET) IMPLANT
BASKET STNLS GEMINI 4WIRE 3FR (BASKET) IMPLANT
BASKET STONE 1.7 NGAGE (UROLOGICAL SUPPLIES) ×4 IMPLANT
BASKET ZERO TIP NITINOL 2.4FR (BASKET) IMPLANT
CANISTER SUCT LVC 12 LTR MEDI- (MISCELLANEOUS) ×4 IMPLANT
CATH INTERMIT  6FR 70CM (CATHETERS) ×4 IMPLANT
CATH URET 5FR 28IN CONE TIP (BALLOONS)
CATH URET 5FR 28IN OPEN ENDED (CATHETERS) IMPLANT
CATH URET 5FR 70CM CONE TIP (BALLOONS) IMPLANT
CATH URET DUAL LUMEN 6-10FR 50 (CATHETERS) IMPLANT
CLOTH BEACON ORANGE TIMEOUT ST (SAFETY) ×4 IMPLANT
DRAPE CAMERA CLOSED 9X96 (DRAPES) ×4 IMPLANT
ELECT REM PT RETURN 9FT ADLT (ELECTROSURGICAL)
ELECTRODE REM PT RTRN 9FT ADLT (ELECTROSURGICAL) IMPLANT
FIBER LASER TRAC TIP (UROLOGICAL SUPPLIES) ×4 IMPLANT
GLOVE BIO SURGEON STRL SZ 6.5 (GLOVE) ×3 IMPLANT
GLOVE BIO SURGEON STRL SZ7.5 (GLOVE) ×4 IMPLANT
GLOVE BIO SURGEONS STRL SZ 6.5 (GLOVE) ×1
GLOVE BIOGEL PI IND STRL 6.5 (GLOVE) ×4 IMPLANT
GLOVE BIOGEL PI INDICATOR 6.5 (GLOVE) ×4
GOWN PREVENTION PLUS LG XLONG (DISPOSABLE) IMPLANT
GOWN STRL REIN XL XLG (GOWN DISPOSABLE) IMPLANT
GOWN STRL REUS W/TWL LRG LVL3 (GOWN DISPOSABLE) ×4 IMPLANT
GOWN STRL REUS W/TWL XL LVL3 (GOWN DISPOSABLE) ×4 IMPLANT
GUIDEWIRE 0.038 PTFE COATED (WIRE) IMPLANT
GUIDEWIRE ANG ZIPWIRE 038X150 (WIRE) ×4 IMPLANT
GUIDEWIRE STR DUAL SENSOR (WIRE) ×4 IMPLANT
IV NS IRRIG 3000ML ARTHROMATIC (IV SOLUTION) ×8 IMPLANT
KIT BALLIN UROMAX 15FX10 (LABEL) IMPLANT
KIT BALLN UROMAX 15FX4 (MISCELLANEOUS) IMPLANT
KIT BALLN UROMAX 26 75X4 (MISCELLANEOUS)
PACK CYSTO (CUSTOM PROCEDURE TRAY) ×4 IMPLANT
SET HIGH PRES BAL DIL (LABEL)
SHEATH ACCESS URETERAL 38CM (SHEATH) ×4 IMPLANT
STENT URET 6FRX26 CONTOUR (STENTS) ×4 IMPLANT
SYRINGE 10CC LL (SYRINGE) ×4 IMPLANT
SYRINGE IRR TOOMEY STRL 70CC (SYRINGE) IMPLANT

## 2014-01-28 NOTE — Anesthesia Procedure Notes (Addendum)
Procedure Name: LMA Insertion Date/Time: 01/28/2014 9:59 AM Performed by: Jessica PriestBEESON, Blaize Nipper C Pre-anesthesia Checklist: Patient identified, Emergency Drugs available, Suction available and Patient being monitored Patient Re-evaluated:Patient Re-evaluated prior to inductionOxygen Delivery Method: Circle System Utilized Preoxygenation: Pre-oxygenation with 100% oxygen Intubation Type: IV induction Ventilation: Mask ventilation without difficulty LMA: LMA inserted LMA Size: 4.0 Number of attempts: 1 Airway Equipment and Method: Bite block Placement Confirmation: positive ETCO2 Tube secured with: Tape Dental Injury: Teeth and Oropharynx as per pre-operative assessment  Comments: Cotton gauze placed along corner of right side of mouth with oral bite block. Lips cracked dry preop - LB

## 2014-01-28 NOTE — Op Note (Signed)
Pre-operative diagnosis: left UPJ stone, left Renal stone Post-operative diagnosis: same  Procedure: Left ureteroscopy, holmium laser lithotripsy, stone basket extraction, stent placement  Surgeon:Keelia Graybill  Anesthesia: Denenny  Type of anesthesia:General  Indication for procedure:39 year old with obstructing left UPJ stone status post left ureteral stent.  Patient presents for definitive stone management.  Findings:UPJ stone pushed into the upper pole By the wire.  Large lower pole stone that the lower pole renal papilla had grown up into or the stone formed around a very long papilla.Only clinically insignificant fragments of less than 1 mm remained.  Description of procedure:After consent was obtained patient brought to the operating room.She was placed in lithotomy position and prepped and draped in the usual sterile fashion.  A timeout was performed to confirm the patient and procedure.  Cystoscope was passed per urethra and the left ureteral stent grasped and removed to the urethral meatus.The stent was removed and through it a sensor wire advanced.  A ureteral access sheath was advanced because the ureter was clear on scout imaging and I could see the large stone in the UPJ as well as the long left lower pole stone.  A second wire was advanced through the access sheath after removing then the cannula.  The ureteral access sheath was removed and passed over the sensor wire leaving the Glidewire as a safety wire.  The flexible digital ureteroscope was advanced in the large UPJ stone had been pushed into the upper pole by the wire.  This was an excellent place for lithotripsy.  At a setting of 0.4 and 40 the stone was dusted and once a more manageable piece remained a back down to 0.8 and 8 to create some smaller pieces.  I then turned my attention to the lower pole and began at 0.4 and 40 lasering the stone.  The upper pole and the lower pole stone turned to dust about 90%.  Interestingly the  middle of the lower pole stone was oozing some blood and I realized it was either matrix or tissue in the center of this stone.  It turned out to appear the papilla had grown up through the middle of the stone.  The stone was also reduced to small fragments.  I then spent a considerable amount of time with an engage basket removing the upper pole and lower pole fragments.  These were collected and given to the patient.  All that remained were some less than 1 mm fragments in the upper and lower pole.  There was no stone visible anymore on scout imaging.  The renal pelvis and UPJ were normal.  The proximal ureter normal.  The scope was backed out to the access sheath and both withdrawn together and the ureter was noted to be normal without stone or injury.  The wire was backloaded on the cystoscope and a 6 x 26 cm stent was advanced.  The wire was removed and a good coil seen in the kidney and a good coil in the bladder.  The bladder was drained and the scope removed.  A B&O suppository was placed.  The patient was awakened taken to recovery room in stable condition.  Complications:  EBL: minimal  Drains: 6 x 26 cm left ureteral stent with string  Specimens: stone fragments to patient/lab  Disposition: Patient stable to PACU

## 2014-01-28 NOTE — Transfer of Care (Signed)
Immediate Anesthesia Transfer of Care Note  Patient: Charlotte Robbins  Procedure(s) Performed: Procedure(s) (LRB): HOLMIUM LASER APPLICATION (Left) CYSTOSCOPY WITH STENT REPLACEMENT (Left) CYSTOSCOPY WITH URETEROSCOPY, STONE ENTRACTION (Left)  Patient Location: PACU  Anesthesia Type: General  Level of Consciousness: awake, sedated, patient cooperative and responds to stimulation  Airway & Oxygen Therapy: Patient Spontanous Breathing and Patient connected to face mask oxygen  Post-op Assessment: Report given to PACU RN, Post -op Vital signs reviewed and stable and Patient moving all extremities  Post vital signs: Reviewed and stable  Complications: No apparent anesthesia complications

## 2014-01-28 NOTE — Interval H&P Note (Signed)
History and Physical Interval Note:  01/28/2014 9:50 AM  Charlotte Robbins  has presented today for surgery, with the diagnosis of LEFT NEPHROLITHIASIS  The various methods of treatment have been discussed with the patient and family. After consideration of risks, benefits and other options for treatment, the patient has consented to  Procedure(s) with comments: URETEROSCOPY (Left) HOLMIUM LASER APPLICATION (Left) CYSTOSCOPY WITH STENT PLACEMENT (Left) - C-ARM as a surgical intervention .  The patient's history has been reviewed, patient examined, no change in status, stable for surgery.  I have reviewed the patient's chart and labs.  Questions were answered to the patient's satisfaction.  Pt without dysuria or fever.    [name]

## 2014-01-28 NOTE — Anesthesia Preprocedure Evaluation (Signed)
Anesthesia Evaluation  Patient identified by MRN, date of birth, ID band Patient awake    Reviewed: Allergy & Precautions, NPO status , Patient's Chart, lab work & pertinent test results  Airway Mallampati: II  TM Distance: >3 FB Neck ROM: Full    Dental no notable dental hx.    Pulmonary neg pulmonary ROS,  breath sounds clear to auscultation  Pulmonary exam normal       Cardiovascular negative cardio ROS  Rhythm:Regular Rate:Normal     Neuro/Psych negative neurological ROS  negative psych ROS   GI/Hepatic Neg liver ROS, GERD-  Medicated,  Endo/Other  negative endocrine ROS  Renal/GU Renal disease  negative genitourinary   Musculoskeletal negative musculoskeletal ROS (+)   Abdominal   Peds negative pediatric ROS (+)  Hematology negative hematology ROS (+)   Anesthesia Other Findings   Reproductive/Obstetrics negative OB ROS                             Anesthesia Physical Anesthesia Plan  ASA: II  Anesthesia Plan: General   Post-op Pain Management:    Induction: Intravenous  Airway Management Planned: LMA  Additional Equipment:   Intra-op Plan:   Post-operative Plan: Extubation in OR  Informed Consent: I have reviewed the patients History and Physical, chart, labs and discussed the procedure including the risks, benefits and alternatives for the proposed anesthesia with the patient or authorized representative who has indicated his/her understanding and acceptance.   Dental advisory given  Plan Discussed with: CRNA  Anesthesia Plan Comments:         Anesthesia Quick Evaluation

## 2014-01-28 NOTE — Discharge Instructions (Signed)
Ureteral Stent Implantation, Care After Refer to this sheet in the next few weeks. These instructions provide you with information on caring for yourself after your procedure. Your health care provider may also give you more specific instructions. Your treatment has been planned according to current medical practices, but problems sometimes occur. Call your health care provider if you have any problems or questions after your procedure. WHAT TO EXPECT AFTER THE PROCEDURE You should be back to normal activity within 48 hours after the procedure. Nausea and vomiting may occur and are commonly the result of anesthesia. It is common to experience sharp pain in the back or lower abdomen and penis with voiding. This is caused by movement of the ends of the stent with the act of urinating.It usually goes away within minutes after you have stopped urinating. HOME CARE INSTRUCTIONS Make sure to drink plenty of fluids. You may have small amounts of bleeding, causing your urine to be red. This is normal. Certain movements may trigger pain or a feeling that you need to urinate. You may be given medicines to prevent infection or bladder spasms. Be sure to take all medicines as directed. Only take over-the-counter or prescription medicines for pain, discomfort, or fever as directed by your health care provider. Do not take aspirin, as this can make bleeding worse.  REMOVAL OF THE STENT:  Remove the stent by pulling the string with slow steady pressure on Monday morning January 31, 2014.  SEEK MEDICAL CARE IF:  You experience increasing pain.  Your pain medicine is not working. SEEK IMMEDIATE MEDICAL CARE IF:  Your urine is dark red or has blood clots.  You are leaking urine (incontinent).  You have a fever, chills, feeling sick to your stomach (nausea), or vomiting.  Your pain is not relieved by pain medicine.  The end of the stent comes out of the urethra.  You are unable to urinate. Document  Released: 09/02/2012 Document Revised: 01/05/2013 Document Reviewed: 09/02/2012 [facility] Patient Information 2015 [place], [place]. This information is not intended to replace advice given to you by your health care provider. Make sure you discuss any questions you have with your health care provider.   Post Anesthesia Home Care Instructions  Activity: Get plenty of rest for the remainder of the day. A responsible adult should stay with you for 24 hours following the procedure.  For the next 24 hours, DO NOT: -Drive a car -Advertising copywriterperate machinery -Drink alcoholic beverages -Take any medication unless instructed by your physician -Make any legal decisions or sign important papers.  Meals: Start with liquid foods such as gelatin or soup. Progress to regular foods as tolerated. Avoid greasy, spicy, heavy foods. If nausea and/or vomiting occur, drink only clear liquids until the nausea and/or vomiting subsides. Call your physician if vomiting continues.  Special Instructions/Symptoms: Your throat may feel dry or sore from the anesthesia or the breathing tube placed in your throat during surgery. If this causes discomfort, gargle with warm salt water. The discomfort should disappear within 24 hours.

## 2014-02-01 ENCOUNTER — Encounter (HOSPITAL_BASED_OUTPATIENT_CLINIC_OR_DEPARTMENT_OTHER): Payer: Self-pay | Admitting: Urology

## 2014-02-01 NOTE — Anesthesia Postprocedure Evaluation (Signed)
  Anesthesia Post-op Note  Patient: Charlotte Robbins  Procedure(s) Performed: Procedure(s) (LRB): HOLMIUM LASER APPLICATION (Left) CYSTOSCOPY WITH STENT REPLACEMENT (Left) CYSTOSCOPY WITH URETEROSCOPY, STONE ENTRACTION (Left)  Patient Location: PACU  Anesthesia Type: General  Level of Consciousness: awake and alert   Airway and Oxygen Therapy: Patient Spontanous Breathing  Post-op Pain: mild  Post-op Assessment: Post-op Vital signs reviewed, Patient's Cardiovascular Status Stable, Respiratory Function Stable, Patent Airway and No signs of Nausea or vomiting  Last Vitals:  Filed Vitals:   01/28/14 1246  BP: 106/68  Pulse: 90  Temp: 36.6 C  Resp: 18    Post-op Vital Signs: stable   Complications: No apparent anesthesia complications

## 2014-05-25 ENCOUNTER — Other Ambulatory Visit: Payer: Self-pay | Admitting: Obstetrics

## 2014-05-27 ENCOUNTER — Inpatient Hospital Stay (HOSPITAL_COMMUNITY)
Admission: RE | Admit: 2014-05-27 | Discharge: 2014-05-27 | Disposition: A | Discharge status: Home / Self Care | Payer: BLUE CROSS/BLUE SHIELD | Source: Ambulatory Visit

## 2014-05-31 ENCOUNTER — Ambulatory Visit (HOSPITAL_COMMUNITY): Payer: BLUE CROSS/BLUE SHIELD | Admitting: Anesthesiology

## 2014-05-31 ENCOUNTER — Encounter (HOSPITAL_COMMUNITY)
Admission: RE | Disposition: A | Discharge status: Home / Self Care | Payer: Self-pay | Source: Ambulatory Visit | Attending: Obstetrics

## 2014-05-31 ENCOUNTER — Ambulatory Visit (HOSPITAL_COMMUNITY)
Admission: RE | Admit: 2014-05-31 | Discharge: 2014-05-31 | Disposition: A | Discharge status: Home / Self Care | Payer: BLUE CROSS/BLUE SHIELD | Source: Ambulatory Visit | Attending: Obstetrics | Admitting: Obstetrics

## 2014-05-31 DIAGNOSIS — N803 Endometriosis of pelvic peritoneum: Secondary | ICD-10-CM | POA: Diagnosis not present

## 2014-05-31 DIAGNOSIS — N836 Hematosalpinx: Secondary | ICD-10-CM | POA: Diagnosis not present

## 2014-05-31 DIAGNOSIS — N736 Female pelvic peritoneal adhesions (postinfective): Secondary | ICD-10-CM | POA: Insufficient documentation

## 2014-05-31 DIAGNOSIS — N801 Endometriosis of ovary: Secondary | ICD-10-CM | POA: Diagnosis not present

## 2014-05-31 DIAGNOSIS — N804 Endometriosis of rectovaginal septum and vagina: Secondary | ICD-10-CM | POA: Insufficient documentation

## 2014-05-31 DIAGNOSIS — K219 Gastro-esophageal reflux disease without esophagitis: Secondary | ICD-10-CM | POA: Diagnosis not present

## 2014-05-31 DIAGNOSIS — N8320 Unspecified ovarian cysts: Secondary | ICD-10-CM | POA: Insufficient documentation

## 2014-05-31 DIAGNOSIS — Z87442 Personal history of urinary calculi: Secondary | ICD-10-CM | POA: Diagnosis not present

## 2014-05-31 DIAGNOSIS — N800 Endometriosis of uterus: Secondary | ICD-10-CM | POA: Insufficient documentation

## 2014-05-31 DIAGNOSIS — R102 Pelvic and perineal pain: Secondary | ICD-10-CM | POA: Diagnosis present

## 2014-05-31 HISTORY — PX: BILATERAL SALPINGECTOMY: SHX5743

## 2014-05-31 HISTORY — PX: ROBOTIC ASSISTED LAPAROSCOPIC LYSIS OF ADHESION: SHX6080

## 2014-05-31 HISTORY — PX: ROBOTIC ASSISTED LAPAROSCOPIC OVARIAN CYSTECTOMY: SHX6081

## 2014-05-31 HISTORY — PX: CYSTOSCOPY: SHX5120

## 2014-05-31 LAB — ABO/RH: ABO/RH(D): O NEG

## 2014-05-31 LAB — TYPE AND SCREEN
ABO/RH(D): O NEG
Antibody Screen: NEGATIVE

## 2014-05-31 LAB — CBC
HEMATOCRIT: 35.3 % — AB (ref 36.0–46.0)
HEMOGLOBIN: 11.4 g/dL — AB (ref 12.0–15.0)
MCH: 24.7 pg — ABNORMAL LOW (ref 26.0–34.0)
MCHC: 32.3 g/dL (ref 30.0–36.0)
MCV: 76.6 fL — ABNORMAL LOW (ref 78.0–100.0)
Platelets: 272 10*3/uL (ref 150–400)
RBC: 4.61 MIL/uL (ref 3.87–5.11)
RDW: 14.5 % (ref 11.5–15.5)
WBC: 9.5 10*3/uL (ref 4.0–10.5)

## 2014-05-31 LAB — PREGNANCY, URINE: Preg Test, Ur: NEGATIVE

## 2014-05-31 SURGERY — ROBOTIC ASSISTED LAPAROSCOPIC OVARIAN CYSTECTOMY
Anesthesia: General | Site: Bladder

## 2014-05-31 MED ORDER — PHENYLEPHRINE 40 MCG/ML (10ML) SYRINGE FOR IV PUSH (FOR BLOOD PRESSURE SUPPORT)
PREFILLED_SYRINGE | INTRAVENOUS | Status: AC
  Filled 2014-05-31: qty 20

## 2014-05-31 MED ORDER — METHYLENE BLUE 1 % INJ SOLN
INTRAMUSCULAR | Status: AC
Start: 2014-05-31 — End: 2014-05-31
  Filled 2014-05-31: qty 10

## 2014-05-31 MED ORDER — MIDAZOLAM HCL 2 MG/2ML IJ SOLN
INTRAMUSCULAR | Status: AC
  Filled 2014-05-31: qty 2

## 2014-05-31 MED ORDER — FENTANYL CITRATE (PF) 250 MCG/5ML IJ SOLN
INTRAMUSCULAR | Status: AC
  Filled 2014-05-31: qty 5

## 2014-05-31 MED ORDER — HYDROMORPHONE HCL 1 MG/ML IJ SOLN
0.2500 mg | INTRAMUSCULAR | Status: DC | PRN
  Administered 2014-05-31 (×2): 0.5 mg via INTRAVENOUS

## 2014-05-31 MED ORDER — NEOSTIGMINE METHYLSULFATE 10 MG/10ML IV SOLN
INTRAVENOUS | Status: AC
  Filled 2014-05-31: qty 1

## 2014-05-31 MED ORDER — KETOROLAC TROMETHAMINE 30 MG/ML IJ SOLN
INTRAMUSCULAR | Status: DC | PRN
  Administered 2014-05-31: 30 mg via INTRAVENOUS

## 2014-05-31 MED ORDER — LIDOCAINE HCL (CARDIAC) 20 MG/ML IV SOLN
INTRAVENOUS | Status: DC | PRN
  Administered 2014-05-31: 100 mg via INTRAVENOUS

## 2014-05-31 MED ORDER — DEXAMETHASONE SODIUM PHOSPHATE 10 MG/ML IJ SOLN
INTRAMUSCULAR | Status: DC | PRN
  Administered 2014-05-31: 4 mg via INTRAVENOUS

## 2014-05-31 MED ORDER — PROPOFOL 10 MG/ML IV BOLUS
INTRAVENOUS | Status: AC
  Filled 2014-05-31: qty 20

## 2014-05-31 MED ORDER — MIDAZOLAM HCL 2 MG/2ML IJ SOLN
INTRAMUSCULAR | Status: DC | PRN
  Administered 2014-05-31: 2 mg via INTRAVENOUS

## 2014-05-31 MED ORDER — ROPIVACAINE HCL 5 MG/ML IJ SOLN
INTRAMUSCULAR | Status: AC
  Filled 2014-05-31: qty 30

## 2014-05-31 MED ORDER — OXYCODONE-ACETAMINOPHEN 5-325 MG PO TABS
1.0000 | ORAL_TABLET | ORAL | Status: DC | PRN
  Administered 2014-05-31: 1 via ORAL

## 2014-05-31 MED ORDER — ROPIVACAINE HCL 5 MG/ML IJ SOLN
INTRAMUSCULAR | Status: DC | PRN
  Administered 2014-05-31: 60 mL

## 2014-05-31 MED ORDER — BUPIVACAINE HCL (PF) 0.25 % IJ SOLN
INTRAMUSCULAR | Status: DC | PRN
Start: 2014-05-31 — End: 2014-05-31
  Administered 2014-05-31: 11 mL

## 2014-05-31 MED ORDER — FAMOTIDINE IN NACL 20-0.9 MG/50ML-% IV SOLN
INTRAVENOUS | Status: AC
Start: 2014-05-31 — End: 2014-05-31
  Administered 2014-05-31: 20 mg
  Filled 2014-05-31: qty 50

## 2014-05-31 MED ORDER — BUPIVACAINE HCL (PF) 0.25 % IJ SOLN
INTRAMUSCULAR | Status: AC
  Filled 2014-05-31: qty 30

## 2014-05-31 MED ORDER — LACTATED RINGERS IV SOLN
INTRAVENOUS | Status: DC
  Administered 2014-05-31 (×2): via INTRAVENOUS

## 2014-05-31 MED ORDER — IBUPROFEN 600 MG PO TABS: 600.0000 mg | ORAL_TABLET | Freq: Four times a day (QID) | ORAL | Status: AC | PRN

## 2014-05-31 MED ORDER — OXYCODONE-ACETAMINOPHEN 5-325 MG PO TABS: 2.0000 | ORAL_TABLET | ORAL | Status: AC | PRN

## 2014-05-31 MED ORDER — ACETAMINOPHEN 160 MG/5ML PO SOLN
975.0000 mg | Freq: Once | ORAL | Status: AC
  Administered 2014-05-31: 975 mg via ORAL

## 2014-05-31 MED ORDER — ACETAMINOPHEN 160 MG/5ML PO SOLN
ORAL | Status: AC
  Administered 2014-05-31: 975 mg via ORAL
  Filled 2014-05-31: qty 40.6

## 2014-05-31 MED ORDER — STERILE WATER FOR IRRIGATION IR SOLN
Status: DC | PRN
  Administered 2014-05-31: 1000 mL via INTRAVESICAL

## 2014-05-31 MED ORDER — DEXAMETHASONE SODIUM PHOSPHATE 4 MG/ML IJ SOLN
INTRAMUSCULAR | Status: AC
  Filled 2014-05-31: qty 1

## 2014-05-31 MED ORDER — ONDANSETRON HCL 4 MG/2ML IJ SOLN
INTRAMUSCULAR | Status: DC | PRN
  Administered 2014-05-31: 4 mg via INTRAVENOUS

## 2014-05-31 MED ORDER — GLYCOPYRROLATE 0.2 MG/ML IJ SOLN
INTRAMUSCULAR | Status: AC
  Filled 2014-05-31: qty 3

## 2014-05-31 MED ORDER — ONDANSETRON HCL 4 MG/2ML IJ SOLN
INTRAMUSCULAR | Status: AC
  Filled 2014-05-31: qty 2

## 2014-05-31 MED ORDER — NORETHINDRONE ACETATE 5 MG PO TABS: 10.0000 mg | ORAL_TABLET | Freq: Every day | ORAL | Status: AC

## 2014-05-31 MED ORDER — NEOSTIGMINE METHYLSULFATE 10 MG/10ML IV SOLN
INTRAVENOUS | Status: DC | PRN
  Administered 2014-05-31: 3 mg via INTRAVENOUS

## 2014-05-31 MED ORDER — GLYCOPYRROLATE 0.2 MG/ML IJ SOLN
INTRAMUSCULAR | Status: DC | PRN
  Administered 2014-05-31: 0.6 mg via INTRAVENOUS

## 2014-05-31 MED ORDER — CEFAZOLIN SODIUM-DEXTROSE 2-3 GM-% IV SOLR
2.0000 g | Freq: Once | INTRAVENOUS | Status: AC
  Administered 2014-05-31: 2 g via INTRAVENOUS
  Filled 2014-05-31: qty 50

## 2014-05-31 MED ORDER — SODIUM CHLORIDE 0.9 % IJ SOLN
INTRAMUSCULAR | Status: AC
  Filled 2014-05-31: qty 50

## 2014-05-31 MED ORDER — KETOROLAC TROMETHAMINE 30 MG/ML IJ SOLN
INTRAMUSCULAR | Status: AC
  Filled 2014-05-31: qty 1

## 2014-05-31 MED ORDER — SCOPOLAMINE 1 MG/3DAYS TD PT72
MEDICATED_PATCH | TRANSDERMAL | Status: AC
  Administered 2014-05-31: 1.5 mg via TRANSDERMAL
  Filled 2014-05-31: qty 1

## 2014-05-31 MED ORDER — FENTANYL CITRATE (PF) 100 MCG/2ML IJ SOLN
INTRAMUSCULAR | Status: DC | PRN
  Administered 2014-05-31: 50 ug via INTRAVENOUS
  Administered 2014-05-31: 100 ug via INTRAVENOUS
  Administered 2014-05-31 (×4): 50 ug via INTRAVENOUS

## 2014-05-31 MED ORDER — HYDROMORPHONE HCL 1 MG/ML IJ SOLN
INTRAMUSCULAR | Status: AC
  Filled 2014-05-31: qty 1

## 2014-05-31 MED ORDER — ROCURONIUM BROMIDE 100 MG/10ML IV SOLN
INTRAVENOUS | Status: DC | PRN
  Administered 2014-05-31: 20 mg via INTRAVENOUS
  Administered 2014-05-31: 10 mg via INTRAVENOUS
  Administered 2014-05-31: 20 mg via INTRAVENOUS
  Administered 2014-05-31: 50 mg via INTRAVENOUS

## 2014-05-31 MED ORDER — SCOPOLAMINE 1 MG/3DAYS TD PT72
1.0000 | MEDICATED_PATCH | Freq: Once | TRANSDERMAL | Status: DC
  Administered 2014-05-31: 1.5 mg via TRANSDERMAL

## 2014-05-31 MED ORDER — PROPOFOL 10 MG/ML IV BOLUS
INTRAVENOUS | Status: DC | PRN
  Administered 2014-05-31: 200 mg via INTRAVENOUS

## 2014-05-31 MED ORDER — METHYLENE BLUE 1 % INJ SOLN
INTRAMUSCULAR | Status: DC | PRN
  Administered 2014-05-31: 50 mg via INTRAVENOUS

## 2014-05-31 MED ORDER — LIDOCAINE HCL (CARDIAC) 20 MG/ML IV SOLN
INTRAVENOUS | Status: AC
  Filled 2014-05-31: qty 5

## 2014-05-31 MED ORDER — PHENYLEPHRINE HCL 10 MG/ML IJ SOLN
INTRAMUSCULAR | Status: DC | PRN
  Administered 2014-05-31 (×3): 40 mg via INTRAVENOUS

## 2014-05-31 MED ORDER — PHENYLEPHRINE 40 MCG/ML (10ML) SYRINGE FOR IV PUSH (FOR BLOOD PRESSURE SUPPORT)
PREFILLED_SYRINGE | INTRAVENOUS | Status: AC
  Filled 2014-05-31: qty 10

## 2014-05-31 MED ORDER — OXYCODONE-ACETAMINOPHEN 5-325 MG PO TABS
ORAL_TABLET | ORAL | Status: AC
  Filled 2014-05-31: qty 1

## 2014-05-31 MED ORDER — HEPARIN SODIUM (PORCINE) 5000 UNIT/ML IJ SOLN
INTRAMUSCULAR | Status: AC
  Filled 2014-05-31: qty 1

## 2014-05-31 MED ORDER — PANTOPRAZOLE SODIUM 20 MG PO TBEC: 20.0000 mg | DELAYED_RELEASE_TABLET | Freq: Every day | ORAL | Status: AC

## 2014-05-31 MED ORDER — LACTATED RINGERS IR SOLN
Status: DC | PRN
  Administered 2014-05-31: 3000 mL

## 2014-05-31 MED ORDER — ROCURONIUM BROMIDE 100 MG/10ML IV SOLN
INTRAVENOUS | Status: AC
  Filled 2014-05-31: qty 1

## 2014-05-31 SURGICAL SUPPLY — 60 items
BARRIER ADHS 3X4 INTERCEED (GAUZE/BANDAGES/DRESSINGS) ×5 IMPLANT
CHLORAPREP W/TINT 26ML (MISCELLANEOUS) ×5 IMPLANT
CLOTH BEACON ORANGE TIMEOUT ST (SAFETY) ×5 IMPLANT
CONT PATH 16OZ SNAP LID 3702 (MISCELLANEOUS) ×5 IMPLANT
COVER BACK TABLE 60X90IN (DRAPES) ×10 IMPLANT
COVER TIP SHEARS 8 DVNC (MISCELLANEOUS) ×3 IMPLANT
COVER TIP SHEARS 8MM DA VINCI (MISCELLANEOUS) ×2
DECANTER SPIKE VIAL GLASS SM (MISCELLANEOUS) ×10 IMPLANT
DRAPE WARM FLUID 44X44 (DRAPE) ×5 IMPLANT
DRSG COVADERM PLUS 2X2 (GAUZE/BANDAGES/DRESSINGS) ×20 IMPLANT
DRSG OPSITE POSTOP 3X4 (GAUZE/BANDAGES/DRESSINGS) ×5 IMPLANT
ELECT REM PT RETURN 9FT ADLT (ELECTROSURGICAL) ×5
ELECTRODE REM PT RTRN 9FT ADLT (ELECTROSURGICAL) ×3 IMPLANT
GAUZE SPONGE 4X4 16PLY XRAY LF (GAUZE/BANDAGES/DRESSINGS) ×5 IMPLANT
GAUZE VASELINE 3X9 (GAUZE/BANDAGES/DRESSINGS) ×5 IMPLANT
GLOVE BIO SURGEON STRL SZ 6.5 (GLOVE) ×4 IMPLANT
GLOVE BIO SURGEONS STRL SZ 6.5 (GLOVE) ×1
GLOVE BIOGEL PI IND STRL 7.0 (GLOVE) ×3 IMPLANT
GLOVE BIOGEL PI INDICATOR 7.0 (GLOVE) ×2
HEMOSTAT SURGICEL 4X8 (HEMOSTASIS) ×5 IMPLANT
IV STOPCOCK 4 WAY 40  W/Y SET (IV SOLUTION) ×2
IV STOPCOCK 4 WAY 40 W/Y SET (IV SOLUTION) ×3 IMPLANT
KIT ACCESSORY DA VINCI DISP (KITS) ×2
KIT ACCESSORY DVNC DISP (KITS) ×3 IMPLANT
LIQUID BAND (GAUZE/BANDAGES/DRESSINGS) ×5 IMPLANT
MANIPULATOR UTERINE 4.5 ZUMI (MISCELLANEOUS) ×5 IMPLANT
NEEDLE HYPO 22GX1.5 SAFETY (NEEDLE) ×5 IMPLANT
NEEDLE INSUFFLATION 120MM (ENDOMECHANICALS) ×5 IMPLANT
OCCLUDER COLPOPNEUMO (BALLOONS) ×5 IMPLANT
PACK ROBOT WH (CUSTOM PROCEDURE TRAY) ×5 IMPLANT
PACK ROBOTIC GOWN (GOWN DISPOSABLE) ×5 IMPLANT
PAD POSITIONER PINK NONSTERILE (MISCELLANEOUS) ×5 IMPLANT
PAD PREP 24X48 CUFFED NSTRL (MISCELLANEOUS) ×10 IMPLANT
SET CYSTO W/LG BORE CLAMP LF (SET/KITS/TRAYS/PACK) ×5 IMPLANT
SET IRRIG TUBING LAPAROSCOPIC (IRRIGATION / IRRIGATOR) ×10 IMPLANT
SET TRI-LUMEN FLTR TB AIRSEAL (TUBING) ×5 IMPLANT
SUT VIC AB 0 CT1 27 (SUTURE)
SUT VIC AB 0 CT1 27XBRD ANTBC (SUTURE) IMPLANT
SUT VIC AB 0 CT2 27 (SUTURE) IMPLANT
SUT VIC AB 2-0 CT1 27 (SUTURE)
SUT VIC AB 2-0 CT1 TAPERPNT 27 (SUTURE) IMPLANT
SUT VIC AB 4-0 PS2 27 (SUTURE) ×10 IMPLANT
SUT VICRYL 0 UR6 27IN ABS (SUTURE) ×10 IMPLANT
SYR 50ML LL SCALE MARK (SYRINGE) ×5 IMPLANT
SYSTEM CONVERTIBLE TROCAR (TROCAR) IMPLANT
TIP UTERINE 5.1X6CM LAV DISP (MISCELLANEOUS) IMPLANT
TIP UTERINE 6.7X10CM GRN DISP (MISCELLANEOUS) IMPLANT
TIP UTERINE 6.7X6CM WHT DISP (MISCELLANEOUS) IMPLANT
TIP UTERINE 6.7X8CM BLUE DISP (MISCELLANEOUS) IMPLANT
TOWEL OR 17X24 6PK STRL BLUE (TOWEL DISPOSABLE) ×15 IMPLANT
TRAY FOLEY BAG SILVER LF 16FR (SET/KITS/TRAYS/PACK) ×5 IMPLANT
TROCAR 12M 150ML BLUNT (TROCAR) ×5 IMPLANT
TROCAR DISP BLADELESS 8 DVNC (TROCAR) ×3 IMPLANT
TROCAR DISP BLADELESS 8MM (TROCAR) ×2
TROCAR OPTI TIP 12M 100M (ENDOMECHANICALS) IMPLANT
TROCAR PORT AIRSEAL 5X120 (TROCAR) ×5 IMPLANT
TROCAR XCEL 12X100 BLDLESS (ENDOMECHANICALS) IMPLANT
TROCAR XCEL NON-BLD 5MMX100MML (ENDOMECHANICALS) IMPLANT
WARMER LAPAROSCOPE (MISCELLANEOUS) ×5 IMPLANT
WATER STERILE IRR 1000ML POUR (IV SOLUTION) ×15 IMPLANT

## 2014-05-31 NOTE — Anesthesia Procedure Notes (Signed)
Procedure Name: Intubation Date/Time: 05/31/2014 1:20 PM Performed by: Elgie CongoMALINOVA, Latessa Tillis H Pre-anesthesia Checklist: Patient identified, Emergency Drugs available, Suction available and Patient being monitored Patient Re-evaluated:Patient Re-evaluated prior to inductionOxygen Delivery Method: Circle system utilized Preoxygenation: Pre-oxygenation with 100% oxygen Intubation Type: IV induction Ventilation: Mask ventilation without difficulty Laryngoscope Size: Mac and 3 Grade View: Grade I Tube type: Oral Tube size: 7.0 mm Number of attempts: 1 Airway Equipment and Method: Stylet Placement Confirmation: ETT inserted through vocal cords under direct vision,  positive ETCO2 and CO2 detector Secured at: 20 cm Tube secured with: Tape Dental Injury: Teeth and Oropharynx as per pre-operative assessment

## 2014-05-31 NOTE — Transfer of Care (Signed)
Immediate Anesthesia Transfer of Care Note  Patient: Charlotte Robbins  Procedure(s) Performed: Procedure(s) with comments: ROBOTIC ASSISTED LAPAROSCOPIC PARTIAL OVARIAN CYSTECTOMY (Bilateral) - 2 1/2 hrs. ROBOTIC ASSISTED LEFT DISTAL SALIPINGECTOMY, RIGHT PARTIAL SALINGECTOMY (Bilateral) ROBOTIC ASSISTED LAPAROSCOPIC LYSIS OF ADHESION / DRAINAGE OF LEFT OVARIAN CYST / DRAINAGE OF RIGHT OVARIAN ENDOMETROMIA (N/A) CYSTOSCOPY (N/A)  Patient Location: PACU  Anesthesia Type:General  Level of Consciousness: awake, alert  and oriented  Airway & Oxygen Therapy: Patient Spontanous Breathing and Patient connected to nasal cannula oxygen  Post-op Assessment: Report given to RN, Post -op Vital signs reviewed and stable and Patient moving all extremities  Post vital signs: Reviewed and stable  Last Vitals:  Filed Vitals:   05/31/14 1100  BP: 102/71  Pulse: 83  Temp: 36.7 C  Resp: 20    Complications: No apparent anesthesia complications

## 2014-05-31 NOTE — H&P (Signed)
See full H&P in scanned docs  CC: b/l dermoid  HPI: R/L plevic pain. U/s c/w b/l dermoid. Pt for operative intervention  Past Medical History  Diagnosis Date  . GERD (gastroesophageal reflux disease)   . History of kidney stones   . Left nephrolithiasis   . Head cold     no fever   Past Surgical History  Procedure Laterality Date  . Orif right wrist fracture  02-01-2010  . Cystoscopy w/ ureteral stent placement Left 01/04/2014    Procedure: CYSTOSCOPY WITH RETROGRADE PYELOGRAM/URETERAL STENT PLACEMENT;  Surgeon: Jerilee FieldMatthew Eskridge, MD;  [facility] [place];  Service: Urology;  Laterality: Left;  . Holmium laser application Left 01/28/2014    Procedure: HOLMIUM LASER APPLICATION;  Surgeon: Jerilee FieldMatthew Eskridge, MD;  [facility] [place];  Service: Urology;  Laterality: Left;  . Cystoscopy w/ ureteral stent placement Left 01/28/2014    Procedure: CYSTOSCOPY WITH STENT REPLACEMENT;  Surgeon: Jerilee FieldMatthew Eskridge, MD;  [facility] [place];  Service: Urology;  Laterality: Left;  . Cystoscopy with ureteroscopy Left 01/28/2014    Procedure: CYSTOSCOPY WITH URETEROSCOPY, STONE ENTRACTION;  Surgeon: Jerilee FieldMatthew Eskridge, MD;  [facility] [place];  Service: Urology;  Laterality: Left;    PE: Filed Vitals:   05/31/14 1100  BP: 102/71  Pulse: 83  Temp: 98.1 F (36.7 C)  TempSrc: Oral  Resp: 20  Height: 5\' 9"  (1.753 m)  Weight: 66.225 kg (146 lb)  SpO2: 100%   Gen: well appearing, no distress Abd: soft, NT, ND LE: NT, SCDs in place  CBC    Component Value Date/Time   WBC 9.5 05/31/2014 1100   RBC 4.61 05/31/2014 1100   HGB 11.4* 05/31/2014 1100   HCT 35.3* 05/31/2014 1100   PLT 272 05/31/2014 1100   MCV 76.6* 05/31/2014 1100   MCH 24.7* 05/31/2014 1100   MCHC 32.3 05/31/2014 1100   RDW 14.5 05/31/2014 1100   LYMPHSABS 0.5* 12/30/2013 2041   MONOABS 0.3 12/30/2013 2041   EOSABS 0.1 12/30/2013 2041   BASOSABS 0.0 12/30/2013  2041    A/P: robotic assisted b/l dermoid resection. R/B d/w pt.   Kiasia Chou A. 05/31/2014 1:09 PM

## 2014-05-31 NOTE — Discharge Instructions (Signed)
DISCHARGE INSTRUCTIONS: Laparoscopy  MAY TAKE IBUPROFEN (MOTRIN, ADVIL) OR ALEVE AFTER 11:05 PM FOR CRAMPS!!! MAY TAKE THE PATCH BEHIND YOUR EAR OFF IN 48 HOURS!! THE DRESSING CAN COME OFF IN 48 HOURS!!  The following instructions have been prepared to help you care for yourself upon your return home today.  Wound care:  Do not get the incision wet for the first 24 hours. The incision should be kept clean and dry.  The Band-Aids or dressings may be removed the day after surgery.  Should the incision become sore, red, and swollen after the first week, check with your doctor.  Personal hygiene:  Shower the day after your procedure.  Activity and limitations:  Do NOT drive or operate any equipment today.  Do NOT lift anything more than 15 pounds for 2-3 weeks after surgery.  Do NOT rest in bed all day.  Walking is encouraged. Walk each day, starting slowly with 5-minute walks 3 or 4 times a day. Slowly increase the length of your walks.  Walk up and down stairs slowly.  Do NOT do strenuous activities, such as golfing, playing tennis, bowling, running, biking, weight lifting, gardening, mowing, or vacuuming for 2-4 weeks. Ask your doctor when it is okay to start.  Diet: Eat a light meal as desired this evening. You may resume your usual diet tomorrow.  Return to work: This is dependent on the type of work you do. For the most part you can return to a desk job within a week of surgery. If you are more active at work, please discuss this with your doctor.  What to expect after your surgery: You may have a slight burning sensation when you urinate on the first day. You may have a very small amount of blood in the urine. Expect to have a small amount of vaginal discharge/light bleeding for 1-2 weeks. It is not unusual to have abdominal soreness and bruising for up to 2 weeks. You may be tired and need more rest for about 1 week. You may experience shoulder pain for 24-72 hours. Lying  flat in bed may relieve it.  Call your doctor for any of the following:  Develop a fever of 100.4 or greater  Inability to urinate 6 hours after discharge from hospital  Severe pain not relieved by pain medications  Persistent of heavy bleeding at incision site  Redness or swelling around incision site after a week  Increasing nausea or vomiting  Patient Signature________________________________________ Nurse Signature_________________________________________

## 2014-05-31 NOTE — Op Note (Signed)
05/31/2014  5:13 PM  PATIENT:  Charlotte Robbins  39 y.o. female  PRE-OPERATIVE DIAGNOSIS:  Pelvic Pain, bilateral dermoid Ovarian Cysts  POST-OPERATIVE DIAGNOSIS:  stage 4 endometriosis, bilateral hematosalpynx, right endometromia, simple left ovarian cyst,   PROCEDURE:  Procedure(s) with comments: ROBOTIC ASSISTED LAPAROSCOPIC PARTIAL OVARIAN CYSTECTOMY (Bilateral) - 2 1/2 hrs. ROBOTIC ASSISTED LEFT DISTAL SALIPINGECTOMY, RIGHT PARTIAL SALINGECTOMY (Bilateral) ROBOTIC ASSISTED LAPAROSCOPIC LYSIS OF ADHESION / DRAINAGE OF LEFT OVARIAN CYST / DRAINAGE OF RIGHT OVARIAN ENDOMETROMIA (N/A) CYSTOSCOPY (N/A)  SURGEON:  Surgeon(s) and Role:    * Noland FordyceKelly Santhiago Collingsworth, MD - Primary    * Olivia Mackieichard Taavon, MD - Assisting  PHYSICIAN ASSISTANT:   ASSISTANTSBilly Coast: Taavon, MD   ANESTHESIA:   local and general  EBL:  Total I/O In: 1000 [I.V.:1000] Out: 1000 [Urine:600; Blood:400]  BLOOD ADMINISTERED:none  DRAINS: Urinary Catheter (Foley)   LOCAL MEDICATIONS USED:  MARCAINE    and BUPIVICAINE   SPECIMEN:  Source of Specimen:  Portion of right and left fallopian tube, right ovarian cyst wall  DISPOSITION OF SPECIMEN:  PATHOLOGY  COUNTS:  YES  TOURNIQUET:  * No tourniquets in log *  DICTATION: .Note written in EPIC  PLAN OF CARE: Discharge to home after PACU  PATIENT DISPOSITION:  PACU - hemodynamically stable.   Delay start of Pharmacological VTE agent (>24hrs) due to surgical blood loss or risk of bleeding: yes  Complications: none Antibiotics: 2 g Ancef post procedure (given due to cystoscopy)  Findings: nl liver edge, normal gallbladder, endometriosis implants on the left pelvic sidewall and along the left round ligament, endometriosis on the posterior uterine wall, rectum densely adhered to the posterior uterine wall, left fallopian tube grossly edematous and filled with blood, tortuous and wrapped/ scared around left ovary, blunted and poorly identified fimbriated edge, left ovarian  fallopian tube complex adherent to the posterior uterus and adhered into the left ovarian fossa. 2 cm simple left ovarian cyst. Enlarged right ovarian endometrioma. Right adnexa composed of a complex of right ovarian endometrioma and dilated right hematosalpinx adherent to small bowel,  right pelvic sidewall, rectum and posterior uterus. Poor identification of fimbriated end of fallopian tube. Inability to free right adnexal complex from the right pelvic sidewall. Normal anterior cul-de-sac.   On cystoscopy bilateral ureteral jets with blue dye secondary to methylene blue. No evidence of bladder laceration.   Indications: This is a 39 year old G1 P1 with history of infertility who was found to have bilateral ovarian cyst and increasing pelvic pain. Patient opted for surgical management with plan for bilateral ovarian cystectomies and prophylactic risk reducing bilateral salpingectomy.  Procedure: After informed consent and discussion of alternatives to surgical management, the patient was taken to the operating room where general anesthesia was initiated without difficulty. She was prepped and draped in normal sterile fashion in the dorsal supine lithotomy position. A Foley catheter was inserted sterilely into the bladder. A bimanual examination was done to assess the size and position of the uterus. A single-tooth tenaculum was used to grasp the anterior lip of the cervix and the cervix was serially dilated to #3 Pratt dilator. A ZUMI uterine manipulator was inserted into the uterus. The uterine balloon was inflated.  Gloved were changed. Attention was then turned to the patient's abdomen. 0.5 % marcaine was used prior to all incision. A total of 17 cc of marcaine was used.  A 10 mm incision was made in the umbilicus and blunt and sharp dissection was done until the fascia was identified. This  was then grasped with Kocher clamps x2 and entered sharply. A pursestring suture of 0 Vicryl was then placed along  the incision and a non-bladed Roseanne RenoHassan was inserted into the peritoneal cavity. Intraperitoneal placement was confirmed with the use of the camera and pneumoperitoneum was created to 15 mm of mercury. The pursestring suture was secured around the port and pneumoperitoneum was maintained. Brief survey of the abdomen and pelvis was done with findings as above. The abdominal wall was assessed and additional port sites were marked. 8 mm incisions were placed in the right (two) and left lower quadrants (2) and non-bladed ports were placed under direct visualization. 20 cc of 1% bupivacaine was infused into the pelvis . The robot was brought to the patient's side and attached with the right side docking. The robotic instruments were placed under direct visualization until proper placement just over the uterus.  I then went to the robotic console. Brief survey of the patient's abdomen and pelvis revealed  findings as above. While the anterior cul-de-sac was free and the uterus was easily identifiable bilateral adnexa were obscured with adhesions and distortion of anatomy. We started on the left side as a probable left tube was identified. Ureter was identified and blunt dissection was done to free the adnexal complex from the left pelvic sidewall and the posterior uterus. During this dissection endometrioma consistent fluid was spilling from the edges of the dissection. It was not clear that an ovarian cyst was entered. The left fallopian tube was entered and dark chocolate fluid returned. There was additional chocolate fluid returned as the adhesions between the ovarian tubal complex was dissected from the posterior uterus. Once the left adnexal anatomy was somewhat identified attention was turned to the right side. Of note approximately 30 minutes was spent in re-creating identifiable anatomy in the left adnexa.  On the right side we started by identifying the right ureter and right infundibulopelvic ligament. The  broad-based right IP ligaments was entering into a large right adnexal complex. This complex included a dilated right fallopian tube and right ovarian endometrioma. About 1 hour of mostly blunt dissection was done in attempt to restore normal anatomic landmarks on the right adnexa. The complex was partially dissected from the right pelvic sidewall but the deep pelvic sidewall towards the posterior cul-de-sac remained densely adherent. The right ureter was in this complex and its course deep in the pelvis could not be followed. Additionally,  despite blunt dissection the bowel remained adherent to the base of the ovarian peritoneal wall complex.  The right round ligament and right uterine ovarian ligament were identified. The right uterine ovarian ligament splayed into this large adnexal complex. Blunt and sharp dissection were used in attempt to free the tube in its course around the right ovary. During this dissection a fluctuant area remote from the ureter and the IP ligament was encountered. It was felt that this was likely a endometrioma. This was entered sharply and copious chocolate brown fluid returned. This was suction irrigated. Part of the cyst wall was removed but the entire cyst wall was not removed due to inability to adequately assess the borders of the ovary, the borders of the cyst wall and the border of the fallopian tube. Given that the cyst was drained and the patient was not prepared for oophorectomy further cyst dissection was not done. Additionally while preoperative plan was to remove the right fallopian tube it's borders were not adequately identified and decision was made to remove only part of  the fallopian tube for hemostasis purposes only. This was done with bipolar cautery. Some additional bleeding was noted at the deep pelvic border. Minimal bipolar cautery was used for hemostasis with release of the pneumoperitoneum no active bleeding was noted and a piece of Surgicel was placed at the  edge of this deep pelvic dissection.  A left distal salpingectomy was performed with bipolar cautery. Starting at the proximal and serial cautery with the bipolar and transected with the monopolar scissors was done. I started at the proximal and as the fallopian tube was more easily identified. As we transected through the mesial salpinx the distal fallopian tube was separated from the ovary. Good hemostasis was noted.   Pneumoperitoneum was dropped and adequate hemostasis was noted the left ureter was seen peristalsing but the distal right ureter was not identified.  The robot was undocked. A Endo Catch bag was placed through the umbilical port and the right ovarian endometrioma cyst wall as as well as the partial right and left fallopian tubes were placed into the Endo Catch bag which was removed through the umbilicus port. Port was replaced pneumoperitoneum re-created no additional bleeding was noted the patient was placed in reverse Trendelenburg and suction irrigation was done to remove pelvic fluid. A piece of Interceed was placed along the posterior uterus and extended into the bilateral ovarian fossa. All gas and ports were removed.   The  pursestring suture at the umbilicus was then closed. No additional fascial incisions were closed due to the small size and the nondilated nature of these incisions. A 4-0 Vicryl was used to close the additional laparoscopic ports sites. Good hemostasis was noted.  The patient was given methylene blue IV and cystoscopy was carried out. No evidence of any bladder lacerations and bilateral peristalsing ureteral jets with blue dye were noted.  Sponge lap and needle counts were correct x3 the patient was woken from general anesthesia having tolerated the procedure well and taken to the recovery room in a stable fashion.

## 2014-05-31 NOTE — Anesthesia Preprocedure Evaluation (Signed)
Anesthesia Evaluation  Patient identified by MRN, date of birth, ID band Patient awake    Reviewed: Allergy & Precautions, H&P , Patient's Chart, lab work & pertinent test results, reviewed documented beta blocker date and time   Airway Mallampati: II  TM Distance: >3 FB Neck ROM: full    Dental no notable dental hx.    Pulmonary  breath sounds clear to auscultation  Pulmonary exam normal       Cardiovascular Rhythm:regular Rate:Normal     Neuro/Psych    GI/Hepatic GERD-  Medicated,  Endo/Other    Renal/GU      Musculoskeletal   Abdominal   Peds  Hematology   Anesthesia Other Findings   Reproductive/Obstetrics                             Anesthesia Physical Anesthesia Plan  ASA: II  Anesthesia Plan: General   Post-op Pain Management:    Induction: Intravenous  Airway Management Planned: Oral ETT  Additional Equipment:   Intra-op Plan:   Post-operative Plan: Extubation in OR  Informed Consent: I have reviewed the patients History and Physical, chart, labs and discussed the procedure including the risks, benefits and alternatives for the proposed anesthesia with the patient or authorized representative who has indicated his/her understanding and acceptance.   Dental Advisory Given and Dental advisory given  Plan Discussed with: CRNA and Surgeon  Anesthesia Plan Comments: (  Discussed general anesthesia, including possible nausea, instrumentation of airway, sore throat,pulmonary aspiration, etc. I asked if the were any outstanding questions, or  concerns before we proceeded. )        Anesthesia Quick Evaluation

## 2014-05-31 NOTE — Brief Op Note (Signed)
05/31/2014  5:13 PM  PATIENT:  Charlotte Robbins  39 y.o. female  PRE-OPERATIVE DIAGNOSIS:  Pelvic Pain, bilateral dermoid Ovarian Cysts  POST-OPERATIVE DIAGNOSIS:  stage 4 endometriosis, bilateral hematosalpynx, right endometromia, simple left ovarian cyst,   PROCEDURE:  Procedure(s) with comments: ROBOTIC ASSISTED LAPAROSCOPIC PARTIAL OVARIAN CYSTECTOMY (Bilateral) - 2 1/2 hrs. ROBOTIC ASSISTED LEFT DISTAL SALIPINGECTOMY, RIGHT PARTIAL SALINGECTOMY (Bilateral) ROBOTIC ASSISTED LAPAROSCOPIC LYSIS OF ADHESION / DRAINAGE OF LEFT OVARIAN CYST / DRAINAGE OF RIGHT OVARIAN ENDOMETROMIA (N/A) CYSTOSCOPY (N/A)  SURGEON:  Surgeon(s) and Role:    * Noland FordyceKelly Raksha Wolfgang, MD - Primary    * Olivia Mackieichard Taavon, MD - Assisting  PHYSICIAN ASSISTANT:   ASSISTANTSBilly [place]: Taavon, MD   ANESTHESIA:   local and general  EBL:  Total I/O In: 1000 [I.V.:1000] Out: 1000 [Urine:600; Blood:400]  BLOOD ADMINISTERED:none  DRAINS: Urinary Catheter (Foley)   LOCAL MEDICATIONS USED:  MARCAINE    and BUPIVICAINE   SPECIMEN:  Source of Specimen:  Portion of right and left fallopian tube, right ovarian cyst wall  DISPOSITION OF SPECIMEN:  PATHOLOGY  COUNTS:  YES  TOURNIQUET:  * No tourniquets in log *  DICTATION: .Note written in EPIC  PLAN OF CARE: Discharge to home after PACU  PATIENT DISPOSITION:  PACU - hemodynamically stable.   Delay start of Pharmacological VTE agent (>24hrs) due to surgical blood loss or risk of bleeding: yes

## 2014-05-31 NOTE — Anesthesia Postprocedure Evaluation (Signed)
Anesthesia Post Note  Patient: Charlotte Robbins  Procedure(s) Performed: Procedure(s) (LRB): ROBOTIC ASSISTED LAPAROSCOPIC PARTIAL OVARIAN CYSTECTOMY (Bilateral) ROBOTIC ASSISTED LEFT DISTAL SALIPINGECTOMY, RIGHT PARTIAL SALINGECTOMY (Bilateral) ROBOTIC ASSISTED LAPAROSCOPIC LYSIS OF ADHESION / DRAINAGE OF LEFT OVARIAN CYST / DRAINAGE OF RIGHT OVARIAN ENDOMETROMIA (N/A) CYSTOSCOPY (N/A)  Anesthesia type: General  Patient location: PACU  Post pain: Pain level controlled  Post assessment: Post-op Vital signs reviewed  Last Vitals:  Filed Vitals:   05/31/14 1800  BP:   Pulse: 80  Temp:   Resp: 18    Post vital signs: Reviewed  Level of consciousness: sedated  Complications: No apparent anesthesia complications

## 2014-06-01 ENCOUNTER — Encounter (HOSPITAL_COMMUNITY): Payer: Self-pay | Admitting: Obstetrics

## 2014-06-01 MED FILL — Heparin Sodium (Porcine) Inj 5000 Unit/ML: INTRAMUSCULAR | Qty: 1 | Status: AC

## 2015-03-07 ENCOUNTER — Other Ambulatory Visit: Payer: Self-pay | Admitting: Obstetrics

## 2015-03-07 DIAGNOSIS — E049 Nontoxic goiter, unspecified: Secondary | ICD-10-CM

## 2015-03-13 ENCOUNTER — Other Ambulatory Visit: Payer: BLUE CROSS/BLUE SHIELD

## 2015-03-13 ENCOUNTER — Ambulatory Visit
Admission: RE | Admit: 2015-03-13 | Discharge: 2015-03-13 | Disposition: A | Discharge status: Home / Self Care | Payer: 59 | Source: Ambulatory Visit | Attending: Obstetrics | Admitting: Obstetrics

## 2015-03-13 DIAGNOSIS — E049 Nontoxic goiter, unspecified: Secondary | ICD-10-CM

## 2015-04-05 ENCOUNTER — Other Ambulatory Visit: Payer: Self-pay | Admitting: Endocrinology

## 2015-04-05 DIAGNOSIS — E049 Nontoxic goiter, unspecified: Secondary | ICD-10-CM

## 2015-09-29 ENCOUNTER — Other Ambulatory Visit: Payer: 59

## 2016-12-11 IMAGING — US US SOFT TISSUE HEAD/NECK
1 series · 13 of 25 positions shown · non-contrast
Comparison: None.

CLINICAL DATA: Enlarged thyroid on physical examination

EXAM:
THYROID ULTRASOUND
TECHNIQUE: Ultrasound examination of the thyroid gland and adjacent soft
tissues was performed.

[Series 1: us soft tissue head/neck · 0.08mm/px · 13 of 84 slices shown]
[im 1/84]
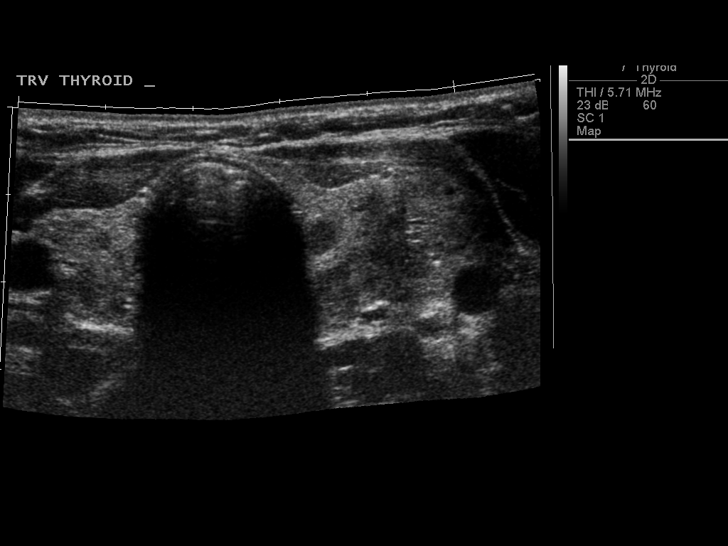
[im 7/84]
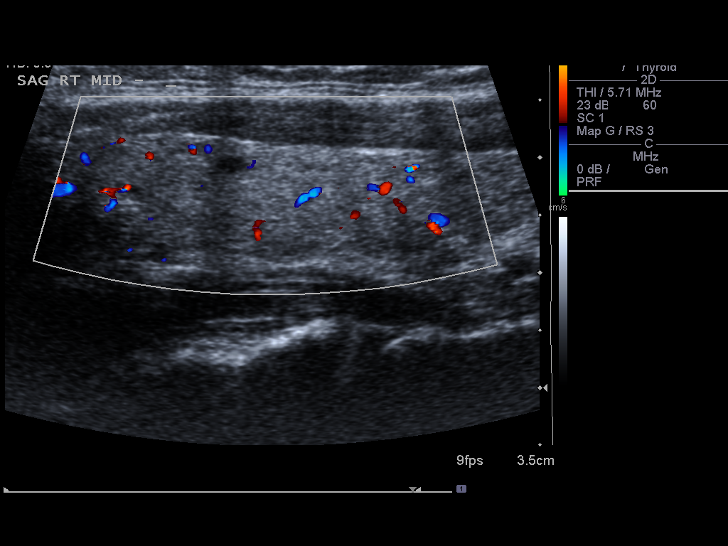
[im 14/84]
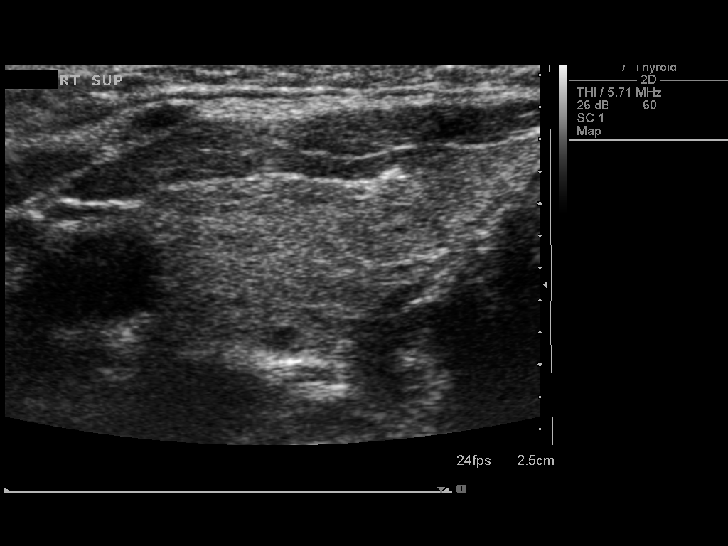
[im 21/84]
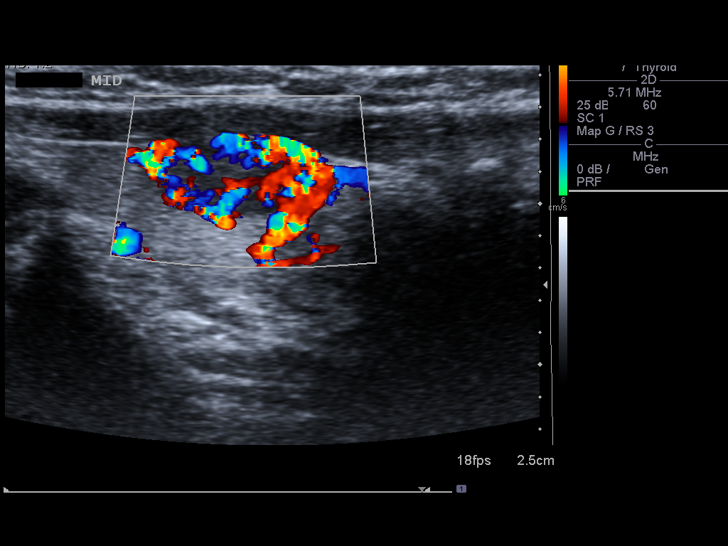
[im 28/84]
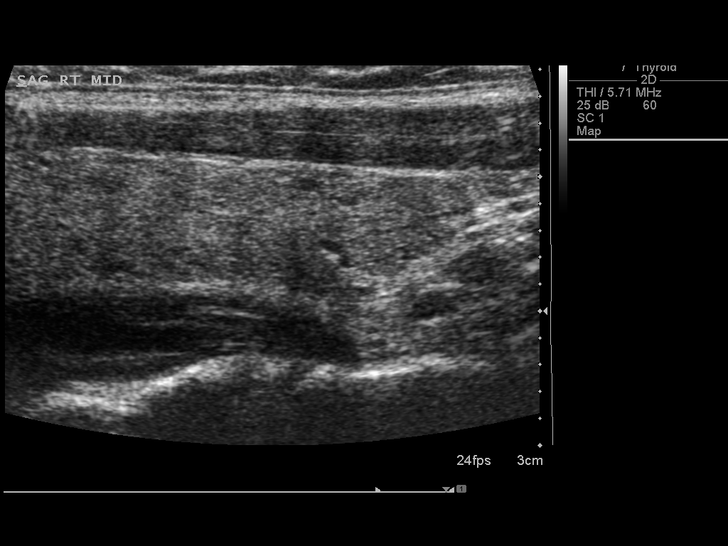
[im 35/84]
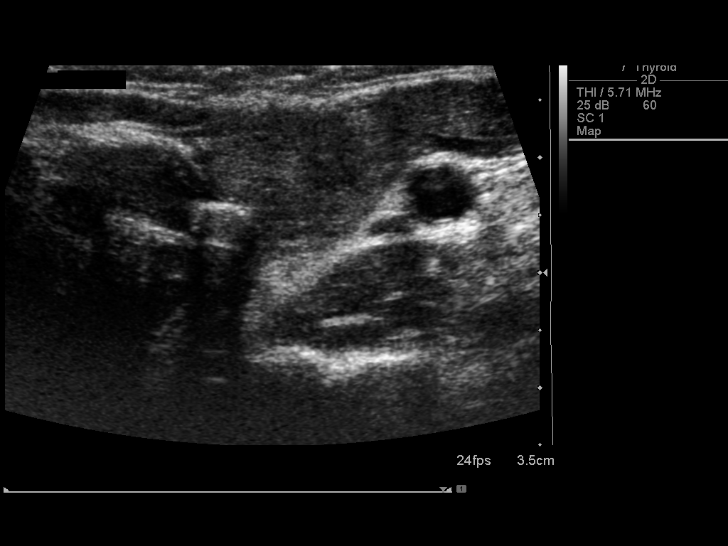
[im 42/84]
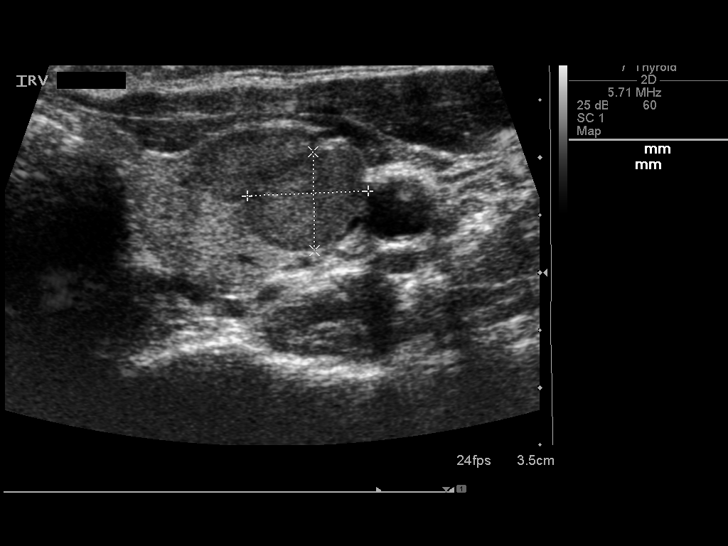
[im 49/84]
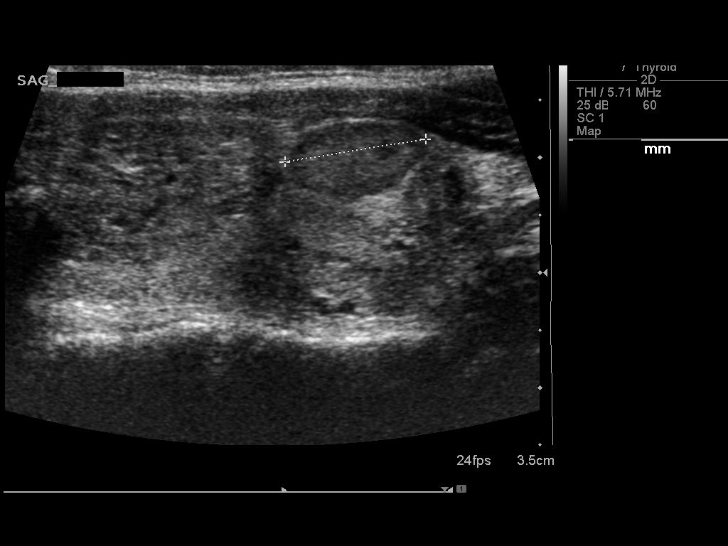
[im 56/84]
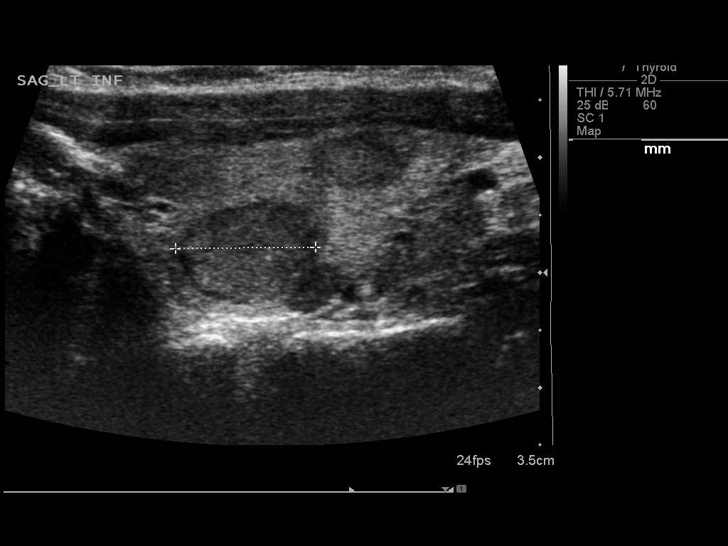
[im 63/84]
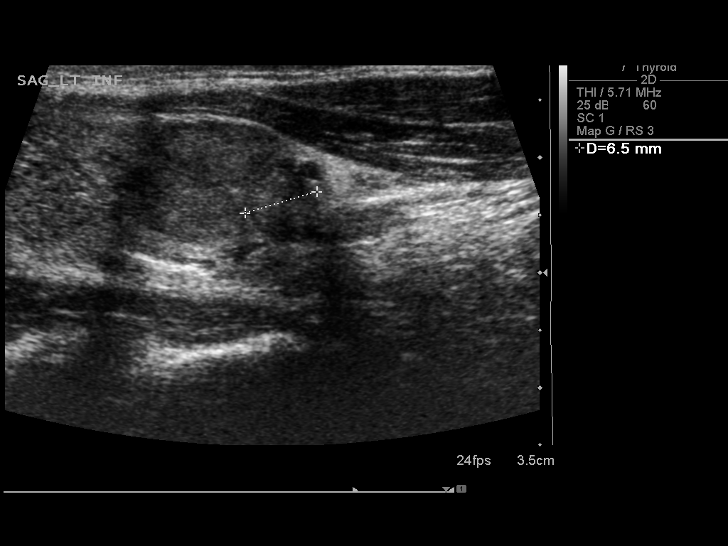
[im 70/84]
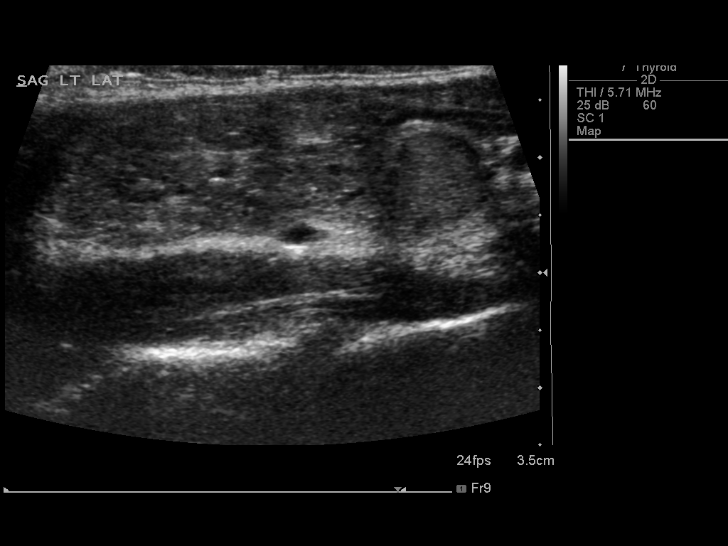
[im 77/84]
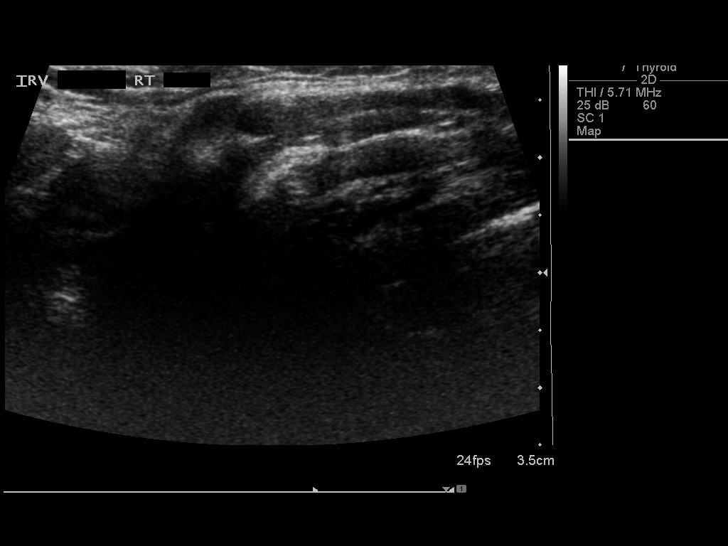
[im 84/84]
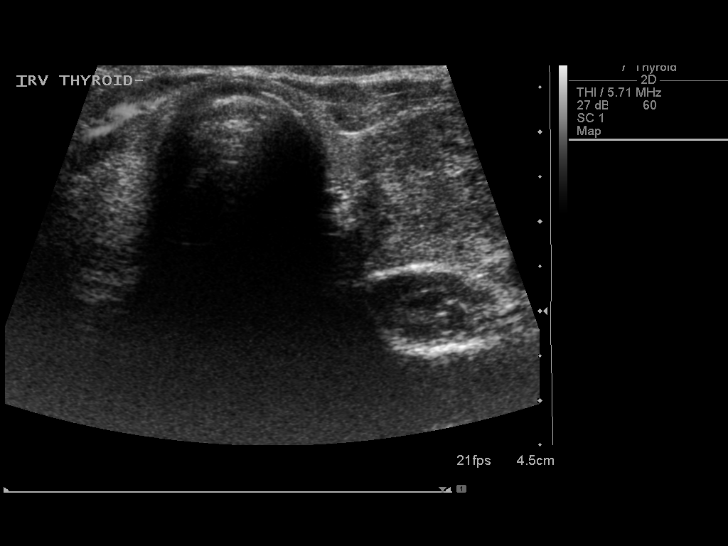

[13 of 25 positions shown; findings below may reference images not displayed]

FINDINGS: There is relative homogeneity the thyroid parenchymal echotexture.

Right thyroid lobe

Measurements: Normal in size measuring 5.3 x 1.2 x 1.7 cm.

Right, superior, posterior - 0.7 x 0.4 x 1.0 cm-hypoechoic, solid.

There are several additional scattered punctate (sub 3 mm nodules
scattered within the remainder of the right lobe of the thyroid.

Left thyroid lobe

Measurements: Normal in size measuring 5.0 x 1.6 x 2.4 cm.

Left, inferior, lateral - 1.1 x 0.9 x 1.0 cm - hypoechoic, solid.

Left, inferior, anterior - 1.1 x 0.6 x 1.2 cm - hypoechoic, solid

Left, inferior, medial - 0.6 x 1.0 x 0.9 cm - hypoechoic, solid.

Left, inferior, posterior - 0.6 cm - hypoechoic, solid

Left, inferior, lateral - 0.6 x 0.8 x 0.7 cm - mixed echogenic,
solid.

Left, inferior, posterior - 0.9 x 0.6 x 0.6 cm - mixed echogenic,
solid, ill-defined, potentially pseudo nodule

Isthmus

Thickness: Normal in size measures 0.2 cm in diameter

Right, lateral - 1.0 x 0.8 x 0.5 cm - hypoechoic, solid.

Lymphadenopathy

None visualized.
IMPRESSION: Findings suggestive of multi nodular goiter. None of the discretely
measured thyroid nodules currently meet imaging criteria to
recommend percutaneous sampling.

This recommendation follows the consensus statement: Management of
Thyroid Nodules Detected at US: Society of Radiologists in
